# Patient Record
Sex: Male | Born: 1935
Health system: Southern US, Community
[De-identification: ages and names within clinical notes are randomized; demographics above are authoritative.]

## PROBLEM LIST (undated history)

## (undated) DIAGNOSIS — I4891 Unspecified atrial fibrillation: Secondary | ICD-10-CM

## (undated) DIAGNOSIS — F419 Anxiety disorder, unspecified: Secondary | ICD-10-CM

## (undated) DIAGNOSIS — I10 Essential (primary) hypertension: Secondary | ICD-10-CM

## (undated) DIAGNOSIS — F329 Major depressive disorder, single episode, unspecified: Secondary | ICD-10-CM

## (undated) DIAGNOSIS — R42 Dizziness and giddiness: Secondary | ICD-10-CM

## (undated) DIAGNOSIS — F32A Depression, unspecified: Secondary | ICD-10-CM

## (undated) DIAGNOSIS — N200 Calculus of kidney: Secondary | ICD-10-CM

## (undated) HISTORY — PX: KIDNEY STONE SURGERY: SHX686

## (undated) HISTORY — DX: Calculus of kidney: N20.0

## (undated) HISTORY — DX: Unspecified atrial fibrillation: I48.91

## (undated) HISTORY — PX: CHOLECYSTECTOMY: SHX55

## (undated) HISTORY — DX: Essential (primary) hypertension: I10

## (undated) HISTORY — DX: Depression, unspecified: F32.A

## (undated) HISTORY — DX: Major depressive disorder, single episode, unspecified: F32.9

## (undated) HISTORY — DX: Anxiety disorder, unspecified: F41.9

---

## 1999-02-24 ENCOUNTER — Encounter: Payer: Self-pay | Admitting: Emergency Medicine

## 1999-02-24 ENCOUNTER — Emergency Department (HOSPITAL_COMMUNITY): Admission: EM | Admit: 1999-02-24 | Discharge: 1999-02-24 | Payer: Self-pay | Admitting: Emergency Medicine

## 1999-02-25 ENCOUNTER — Ambulatory Visit (HOSPITAL_COMMUNITY): Admission: RE | Admit: 1999-02-25 | Discharge: 1999-02-25 | Payer: Self-pay | Admitting: Gastroenterology

## 2001-10-14 ENCOUNTER — Ambulatory Visit (HOSPITAL_COMMUNITY): Admission: RE | Admit: 2001-10-14 | Discharge: 2001-10-14 | Payer: Self-pay | Admitting: Gastroenterology

## 2005-09-28 ENCOUNTER — Observation Stay (HOSPITAL_COMMUNITY): Admission: EM | Admit: 2005-09-28 | Discharge: 2005-09-29 | Payer: Self-pay | Admitting: Emergency Medicine

## 2014-10-10 ENCOUNTER — Other Ambulatory Visit (HOSPITAL_COMMUNITY): Payer: Self-pay | Admitting: Family Medicine

## 2014-10-10 DIAGNOSIS — I4891 Unspecified atrial fibrillation: Secondary | ICD-10-CM

## 2014-10-16 ENCOUNTER — Ambulatory Visit (HOSPITAL_COMMUNITY)
Admission: RE | Admit: 2014-10-16 | Discharge: 2014-10-16 | Disposition: A | Discharge status: Home / Self Care | Payer: Medicare PPO | Source: Ambulatory Visit | Attending: Family Medicine | Admitting: Family Medicine

## 2014-10-16 DIAGNOSIS — I4891 Unspecified atrial fibrillation: Secondary | ICD-10-CM | POA: Insufficient documentation

## 2014-10-16 NOTE — Progress Notes (Signed)
  Echocardiogram  2D Echocardiogram has been performed.  Leta Jungling M 10/16/2014, 11:43 AM

## 2014-11-16 ENCOUNTER — Encounter: Payer: Self-pay | Admitting: Cardiology

## 2014-11-16 ENCOUNTER — Ambulatory Visit (INDEPENDENT_AMBULATORY_CARE_PROVIDER_SITE_OTHER): Payer: Medicare PPO | Admitting: Cardiology

## 2014-11-16 ENCOUNTER — Encounter: Payer: Self-pay | Admitting: Internal Medicine

## 2014-11-16 VITALS — BP 150/80 | HR 116 | Ht 72.0 in | Wt 249.0 lb

## 2014-11-16 DIAGNOSIS — I4891 Unspecified atrial fibrillation: Secondary | ICD-10-CM | POA: Insufficient documentation

## 2014-11-16 DIAGNOSIS — I481 Persistent atrial fibrillation: Secondary | ICD-10-CM | POA: Diagnosis not present

## 2014-11-16 DIAGNOSIS — I429 Cardiomyopathy, unspecified: Secondary | ICD-10-CM

## 2014-11-16 DIAGNOSIS — I428 Other cardiomyopathies: Secondary | ICD-10-CM | POA: Insufficient documentation

## 2014-11-16 DIAGNOSIS — I482 Chronic atrial fibrillation, unspecified: Secondary | ICD-10-CM

## 2014-11-16 DIAGNOSIS — I4819 Other persistent atrial fibrillation: Secondary | ICD-10-CM

## 2014-11-16 MED ORDER — DILTIAZEM HCL ER BEADS 180 MG PO CP24: 180.0000 mg | ORAL_CAPSULE | Freq: Every day | ORAL | Status: DC

## 2014-11-16 NOTE — Progress Notes (Signed)
Cardiology Office Note   Date:  11/16/2014   ID:  Gary Bean, DOB 1934-09-30, MRN [id number]  PCP:  Thora Lance, MD  Cardiologist:   Rollene Rotunda, MD   Chief Complaint  Patient presents with  . Atrial Fibrillation      History of Present Illness: Gary Bean is a 80 y.o. male who presents for evaluation of atrial fibrillation. He was found to have this last month when he was being evaluated for HTN.  He was started on Xarelto and Cardizem.  He really doesn't notice that he is in this.  He might feel rare palpitations.  However, he has no other cardiac complaints.  The patient denies any new symptoms such as chest discomfort, neck or arm discomfort. There has been no new shortness of breath, PND or orthopnea. There has been no presyncope or syncope.  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  Of note I did review the results of an echocardiogram which demonstrated mild global hypokinesis with an ejection fraction of 45-50%. There was some moderate tricuspid regurgitation. He did have some left atrial enlargement with some mild mitral regurgitation. The patient has otherwise never had a cardiac history.  Labs done recently demonstrates normal electrolytes and TSH.    Past Medical History  Diagnosis Date  . Atrial fibrillation   . Hypertension   . Depression   . Anxiety   . Nephrolithiasis     Past Surgical History  Procedure Laterality Date  . Cholecystectomy       Current Outpatient Prescriptions  Medication Sig Dispense Refill  . acetaminophen (TYLENOL) 500 MG tablet Take 500 mg by mouth every 6 (six) hours as needed.    . Omega-3 Fatty Acids (FISH OIL) 1000 MG CAPS Take 1,000 mg by mouth.    . Probiotic Product (ALIGN PO) Take by mouth.    . rivaroxaban (XARELTO) 20 MG TABS tablet Take 20 mg by mouth daily with supper.    . tamsulosin (FLOMAX) 0.4 MG CAPS capsule Take 0.4 mg by mouth.    . triamterene-hydrochlorothiazide (MAXZIDE-25)  37.5-25 MG per tablet   3  . diltiazem (TIAZAC) 180 MG 24 hr capsule Take 1 capsule (180 mg total) by mouth daily. 90 capsule 3   No current facility-administered medications for this visit.    Allergies:   Review of patient's allergies indicates not on file.    Social History:  The patient  reports that he has quit smoking. His smoking use included Cigarettes. He has a 10 pack-year smoking history. He does not have any smokeless tobacco history on file.   Family History:  The patient's family history includes CAD (age of onset: 66) in his father; Goiter in his mother; Prostate cancer in his brother; Rectal cancer (age of onset: 46) in his sister.    ROS:  Please see the history of present illness.   Otherwise, review of systems are positive for positive for reflux. Otherwise as stated in the history of present illness and negative for all other systems..   All other systems are reviewed and negative.    PHYSICAL EXAM: VS:  BP 150/80 mmHg  Pulse 116  Ht 6' (1.829 m)  Wt 249 lb (112.946 kg)  BMI 33.76 kg/m2 , BMI Body mass index is 33.76 kg/(m^2). GENERAL:  Well appearing HEENT:  Pupils equal round and reactive, fundi not visualized, oral mucosa unremarkable NECK:  No jugular venous distention, waveform within normal  limits, carotid upstroke brisk and symmetric, no bruits, no thyromegaly LYMPHATICS:  No cervical, inguinal adenopathy LUNGS:  Clear to auscultation bilaterally BACK:  No CVA tenderness CHEST:  Unremarkable HEART:  PMI not displaced or sustained,S1 and S2 within normal limits, no S3,, no clicks, no rubs, no murmurs, irregular ABD:  Flat, positive bowel sounds normal in frequency in pitch, no bruits, no rebound, no guarding, no midline pulsatile mass, no hepatomegaly, no splenomegaly EXT:  2 plus pulses throughout, no edema, no cyanosis no clubbing SKIN:  No rashes no nodules NEURO:  Cranial nerves II through XII grossly intact, motor grossly intact throughout PSYCH:   Cognitively intact, oriented to person place and time    EKG:  EKG is ordered today. The ekg ordered today demonstrates atrial fibrillation, rate 116, axis within normal limits, intervals within normal limits, low voltage limb leads.   Recent Labs: No results found for requested labs within last 365 days.      Wt Readings from Last 3 Encounters:  11/16/14 249 lb (112.946 kg)      Other studies Reviewed: Additional studies/ records that were reviewed today include: Echo and outside labs.  . Review of the above records demonstrates:  Please see elsewhere in the note.     ASSESSMENT AND PLAN:  Atrial fib:  I will apply a 24-hour Holter to make sure this is persistent atrial fibrillation. I'll increase his Cardizem for better rate control. He has only been on the 20 mg dose of Xarelto for 2 weeks. In 2 more weeks after he has been on this uninterrupted I will plan cardioversion. Of note he does drink multiple drinks a week and he will stop this.  Cardiomyopathy:  I suspect this might be rate related. I will reassess this in the future with follow-up echoes after he is rate controlled and 4 in sinus rhythm.  HTN:  His blood pressure is elevated but this will be managed in the context of treating his atrial fibrillation.  Obese:   We talked about this and began education.   Current medicines are reviewed at length with the patient today.  The patient does not have concerns regarding medicines.  The following changes have been made:  See elsewhere  Labs/ tests ordered today include:   Orders Placed This Encounter  Procedures  . Holter monitor - 24 hour     Disposition:   FU with me after the cardioversion.     Signed, Rollene Rotunda, MD  11/16/2014 1:13 PM    [facility]

## 2014-11-16 NOTE — Addendum Note (Signed)
Addended by: Vincenza Hews on: 11/16/2014 02:49 PM   Modules accepted: Orders

## 2014-11-16 NOTE — Patient Instructions (Signed)
Your physician recommends that you schedule a follow-up appointment in: 6 weeks after cardioversion with Dr. Antoine Poche  We will schedule a cardioversion in two weeks  We have increased your Tiazac to 180 mg daily  We will place a monitor for you to wear for 24 hrs                       This will have to be done over at the [address] office

## 2014-11-20 ENCOUNTER — Other Ambulatory Visit: Payer: Self-pay | Admitting: *Deleted

## 2014-11-20 ENCOUNTER — Telehealth: Payer: Self-pay | Admitting: Cardiology

## 2014-11-20 DIAGNOSIS — I482 Chronic atrial fibrillation, unspecified: Secondary | ICD-10-CM

## 2014-11-20 NOTE — Telephone Encounter (Signed)
New Message   Pt called to schedule 24 hour Holter appt per recent office visit  Please place order   Orders Placed This Encounter  Procedures   Holter monitor - 24 hour

## 2014-11-21 NOTE — Addendum Note (Signed)
Addended by: Vincenza Hews on: 11/21/2014 09:51 AM   Modules accepted: Orders

## 2014-11-23 ENCOUNTER — Other Ambulatory Visit: Payer: Self-pay | Admitting: *Deleted

## 2014-11-23 ENCOUNTER — Telehealth: Payer: Self-pay | Admitting: Cardiology

## 2014-11-23 DIAGNOSIS — E878 Other disorders of electrolyte and fluid balance, not elsewhere classified: Secondary | ICD-10-CM

## 2014-11-23 DIAGNOSIS — R531 Weakness: Secondary | ICD-10-CM

## 2014-11-23 LAB — BASIC METABOLIC PANEL
BUN: 22 mg/dL (ref 6–23)
CHLORIDE: 103 meq/L (ref 96–112)
CO2: 27 meq/L (ref 19–32)
Calcium: 8.5 mg/dL (ref 8.4–10.5)
Creat: 0.99 mg/dL (ref 0.50–1.35)
Glucose, Bld: 110 mg/dL — ABNORMAL HIGH (ref 70–99)
Potassium: 3.7 mEq/L (ref 3.5–5.3)
Sodium: 141 mEq/L (ref 135–145)

## 2014-11-23 LAB — TSH: TSH: 0.884 u[IU]/mL (ref 0.350–4.500)

## 2014-11-23 NOTE — Telephone Encounter (Signed)
RN informed Gary Bean and patient. Will have labs placed in computer RN spoke to Fredonia Highland LPN- informed lab orders needed for upcoming cardioversion. Per JC, lab orders entered.

## 2014-11-23 NOTE — Telephone Encounter (Signed)
Katrina need lab orders for the pt who is currently in the office  Thanks

## 2014-11-27 ENCOUNTER — Ambulatory Visit (INDEPENDENT_AMBULATORY_CARE_PROVIDER_SITE_OTHER): Payer: Medicare PPO

## 2014-11-27 DIAGNOSIS — I482 Chronic atrial fibrillation: Secondary | ICD-10-CM | POA: Diagnosis not present

## 2014-11-29 ENCOUNTER — Other Ambulatory Visit: Payer: Self-pay | Admitting: *Deleted

## 2014-11-29 DIAGNOSIS — Z0189 Encounter for other specified special examinations: Secondary | ICD-10-CM

## 2014-11-29 DIAGNOSIS — I4891 Unspecified atrial fibrillation: Secondary | ICD-10-CM

## 2014-11-30 ENCOUNTER — Ambulatory Visit (HOSPITAL_COMMUNITY): Payer: Medicare PPO | Admitting: Anesthesiology

## 2014-11-30 ENCOUNTER — Encounter (HOSPITAL_COMMUNITY): Payer: Self-pay | Admitting: *Deleted

## 2014-11-30 ENCOUNTER — Encounter (HOSPITAL_COMMUNITY)
Admission: RE | Disposition: A | Discharge status: Home / Self Care | Payer: Self-pay | Source: Ambulatory Visit | Attending: Cardiology

## 2014-11-30 ENCOUNTER — Ambulatory Visit (HOSPITAL_COMMUNITY)
Admission: RE | Admit: 2014-11-30 | Discharge: 2014-11-30 | Disposition: A | Discharge status: Home / Self Care | Payer: Medicare PPO | Source: Ambulatory Visit | Attending: Cardiology | Admitting: Cardiology

## 2014-11-30 DIAGNOSIS — Z0189 Encounter for other specified special examinations: Secondary | ICD-10-CM | POA: Diagnosis not present

## 2014-11-30 DIAGNOSIS — I4891 Unspecified atrial fibrillation: Secondary | ICD-10-CM | POA: Insufficient documentation

## 2014-11-30 DIAGNOSIS — Z87891 Personal history of nicotine dependence: Secondary | ICD-10-CM | POA: Insufficient documentation

## 2014-11-30 DIAGNOSIS — I10 Essential (primary) hypertension: Secondary | ICD-10-CM | POA: Diagnosis not present

## 2014-11-30 DIAGNOSIS — R9431 Abnormal electrocardiogram [ECG] [EKG]: Secondary | ICD-10-CM | POA: Diagnosis not present

## 2014-11-30 HISTORY — PX: CARDIOVERSION: SHX1299

## 2014-11-30 SURGERY — CARDIOVERSION
Anesthesia: Monitor Anesthesia Care

## 2014-11-30 MED ORDER — ETOMIDATE 2 MG/ML IV SOLN
INTRAVENOUS | Status: AC
  Filled 2014-11-30: qty 10

## 2014-11-30 MED ORDER — SODIUM CHLORIDE 0.9 % IV SOLN
INTRAVENOUS | Status: DC
  Administered 2014-11-30: 09:00:00 via INTRAVENOUS
  Administered 2014-11-30: 500 mL via INTRAVENOUS

## 2014-11-30 MED ORDER — PROPOFOL 10 MG/ML IV BOLUS
INTRAVENOUS | Status: DC | PRN
  Administered 2014-11-30: 80 mg via INTRAVENOUS

## 2014-11-30 MED ORDER — SODIUM CHLORIDE 0.9 % IJ SOLN
INTRAMUSCULAR | Status: AC
  Filled 2014-11-30: qty 10

## 2014-11-30 MED ORDER — LIDOCAINE HCL (CARDIAC) 20 MG/ML IV SOLN
INTRAVENOUS | Status: DC | PRN
  Administered 2014-11-30: 20 mg via INTRAVENOUS

## 2014-11-30 MED ORDER — PROPOFOL 10 MG/ML IV BOLUS
INTRAVENOUS | Status: AC
  Filled 2014-11-30: qty 20

## 2014-11-30 MED ORDER — SUCCINYLCHOLINE CHLORIDE 20 MG/ML IJ SOLN
INTRAMUSCULAR | Status: AC
  Filled 2014-11-30: qty 2

## 2014-11-30 MED ORDER — LIDOCAINE HCL (CARDIAC) 20 MG/ML IV SOLN
INTRAVENOUS | Status: AC
Start: 2014-11-30 — End: 2014-11-30
  Filled 2014-11-30: qty 10

## 2014-11-30 MED ORDER — SODIUM CHLORIDE 0.9 % IJ SOLN: 3.0000 mL | INTRAMUSCULAR | Status: DC | PRN

## 2014-11-30 MED ORDER — EPHEDRINE SULFATE 50 MG/ML IJ SOLN
INTRAMUSCULAR | Status: AC
  Filled 2014-11-30: qty 1

## 2014-11-30 NOTE — CV Procedure (Signed)
    CARDIOVERSION NOTE  Procedure: Electrical Cardioversion Indications:  Atrial Fibrillation  Procedure Details:  Consent: Risks of procedure as well as the alternatives and risks of each were explained to the (patient/caregiver).  Consent for procedure obtained.  Time Out: Verified patient identification, verified procedure, site/side was marked, verified correct patient position, special equipment/implants available, medications/allergies/relevent history reviewed, required imaging and test results available.  Performed  Patient placed on cardiac monitor, pulse oximetry, supplemental oxygen as necessary.  Sedation given: Propofol per anesthesia Pacer pads placed anterior and posterior chest.  Cardioverted 1 time(s).  Cardioverted at 150J biphasic.  Impression: Findings: Post procedure EKG shows: NSR Complications: None Patient did tolerate procedure well.  Plan: 1. Successful DCCV to NSR with a single 150J biphasic shock. 2. Follow-up with Dr. Antoine Poche.  Time Spent Directly with the Patient:  30 minutes   Chrystie Nose, MD, [facility] Attending Cardiologist [facility]  Chrystie Nose 11/30/2014, 9:31 AM

## 2014-11-30 NOTE — Anesthesia Postprocedure Evaluation (Signed)
  Anesthesia Post-op Note  Patient: Gary Bean  Procedure(s) Performed: Procedure(s) with comments: CARDIOVERSION (N/A) - 09:19 elective cardioversion with propofol anesthesia,  @ 150 joules from Afib to SR with occassional PVC  Patient Location: Endoscopy  Anesthesia Type:General  Level of Consciousness: awake, alert  and oriented  Airway and Oxygen Therapy: Patient Spontanous Breathing and Patient connected to nasal cannula oxygen  Post-op Pain: mild  Post-op Assessment: Post-op Vital signs reviewed, Patient's Cardiovascular Status Stable, Respiratory Function Stable, Patent Airway and Pain level controlled              Post-op Vital Signs: stable  Last Vitals:  Filed Vitals:   11/30/14 0932  BP: 136/82  Pulse:   Temp:   Resp: 13    Complications: No apparent anesthesia complications

## 2014-11-30 NOTE — Anesthesia Preprocedure Evaluation (Addendum)
Anesthesia Evaluation  Patient identified by MRN, date of birth, ID band Patient awake    Reviewed: Allergy & Precautions, NPO status , Patient's Chart, lab work & pertinent test results  Airway Mallampati: II  TM Distance: >3 FB Neck ROM: Full    Dental  (+) Teeth Intact, Dental Advisory Given   Pulmonary former smoker,  breath sounds clear to auscultation        Cardiovascular hypertension, Rhythm:Irregular Rate:Normal     Neuro/Psych    GI/Hepatic   Endo/Other    Renal/GU      Musculoskeletal   Abdominal   Peds  Hematology   Anesthesia Other Findings   Reproductive/Obstetrics                            Anesthesia Physical Anesthesia Plan  ASA: III  Anesthesia Plan: General   Post-op Pain Management:    Induction: Intravenous  Airway Management Planned: Mask  Additional Equipment:   Intra-op Plan:   Post-operative Plan:   Informed Consent: I have reviewed the patients History and Physical, chart, labs and discussed the procedure including the risks, benefits and alternatives for the proposed anesthesia with the patient or authorized representative who has indicated his/her understanding and acceptance.     Plan Discussed with: CRNA and Anesthesiologist  Anesthesia Plan Comments:         Anesthesia Quick Evaluation

## 2014-11-30 NOTE — Discharge Instructions (Signed)
Electrical Cardioversion °Electrical cardioversion is the delivery of a jolt of electricity to change the rhythm of the heart. Sticky patches or metal paddles are placed on the chest to deliver the electricity from a device. This is done to restore a normal rhythm. A rhythm that is too fast or not regular keeps the heart from pumping well. °Electrical cardioversion is done in an emergency if:  °· There is low or no blood pressure as a result of the heart rhythm.   °· Normal rhythm must be restored as fast as possible to protect the brain and heart from further damage.   °· It may save a life. °Cardioversion may be done for heart rhythms that are not immediately life threatening, such as atrial fibrillation or flutter, in which:  °· The heart is beating too fast or is not regular.   °· Medicine to change the rhythm has not worked.   °· It is safe to wait in order to allow time for preparation. °· Symptoms of the abnormal rhythm are bothersome. °· The risk of stroke and other serious problems can be reduced. °LET YOUR HEALTH CARE PROVIDER KNOW ABOUT:  °· Any allergies you have. °· All medicines you are taking, including vitamins, herbs, eye drops, creams, and over-the-counter medicines. °· Previous problems you or members of your family have had with the use of anesthetics.   °· Any blood disorders you have.   °· Previous surgeries you have had.   °· Medical conditions you have. °RISKS AND COMPLICATIONS  °Generally, this is a safe procedure. However, problems can occur and include:  °· Breathing problems related to the anesthetic used. °· A blood clot that breaks free and travels to other parts of your body. This could cause a stroke or other problems. The risk of this is lowered by use of blood-thinning medicine (anticoagulant) prior to the procedure. °· Cardiac arrest (rare). °BEFORE THE PROCEDURE  °· You may have tests to detect blood clots in your heart and to evaluate heart function.  °· You may start taking  anticoagulants so your blood does not clot as easily.   °· Medicines may be given to help stabilize your heart rate and rhythm. °PROCEDURE °· You will be given medicine through an IV tube to reduce discomfort and make you sleepy (sedative).   °· An electrical shock will be delivered. °AFTER THE PROCEDURE °Your heart rhythm will be watched to make sure it does not change.  °Document Released: 05/29/2002 Document Revised: 10/23/2013 Document Reviewed: 12/21/2012 °Exit[employer]® Patient Information ©2015 [employer]. This information is not intended to replace advice given to you by your health care provider. Make sure you discuss any questions you have with your health care provider. ° °Electrical Cardioversion, Care After °Refer to this sheet in the next few weeks. These instructions provide you with information on caring for yourself after your procedure. Your health care provider may also give you more specific instructions. Your treatment has been planned according to current medical practices, but problems sometimes occur. Call your health care provider if you have any problems or questions after your procedure. °WHAT TO EXPECT AFTER THE PROCEDURE °After your procedure, it is typical to have the following sensations: °· Some redness on the skin where the shocks were delivered. If this is tender, a sunburn lotion or hydrocortisone cream may help. °· Possible return of an abnormal heart rhythm within hours or days after the procedure. °HOME CARE INSTRUCTIONS °· Take medicines only as directed by your health care provider. Be sure you understand how and when   to take your medicine. °· Learn how to feel your pulse and check it often. °· Limit your activity for 48 hours after the procedure or as directed by your health care provider. °· Avoid or minimize caffeine and other stimulants as directed by your health care provider. °SEEK MEDICAL CARE IF: °· You feel like your heart is beating too fast or your pulse is not  regular. °· You have any questions about your medicines. °· You have bleeding that will not stop. °SEEK IMMEDIATE MEDICAL CARE IF: °· You are dizzy or feel faint. °· It is hard to breathe or you feel short of breath. °· There is a change in discomfort in your chest. °· Your speech is slurred or you have trouble moving an arm or leg on one side of your body. °· You get a serious muscle cramp that does not go away. °· Your fingers or toes turn cold or blue. °Document Released: 03/29/2013 Document Revised: 10/23/2013 Document Reviewed: 03/29/2013 °ExitCare® Patient Information ©2015 [employer]. This information is not intended to replace advice given to you by your health care provider. Make sure you discuss any questions you have with your health care provider. ° °

## 2014-11-30 NOTE — Transfer of Care (Signed)
Immediate Anesthesia Transfer of Care Note  Patient: Gary Bean  Procedure(s) Performed: Procedure(s) with comments: CARDIOVERSION (N/A) - 09:19 elective cardioversion with propofol anesthesia,  @ 150 joules from Afib to SR with occassional PVC  Patient Location: PACU and Endoscopy Unit  Anesthesia Type:MAC  Level of Consciousness: awake and alert   Airway & Oxygen Therapy: Patient Spontanous Breathing and Patient connected to nasal cannula oxygen  Post-op Assessment: Report given to RN and Post -op Vital signs reviewed and stable  Post vital signs: Reviewed and stable  Last Vitals:  Filed Vitals:   11/30/14 0804  BP: 141/89  Temp: 36.6 C  Resp: 15    Complications: No apparent anesthesia complications

## 2014-11-30 NOTE — H&P (Signed)
     INTERVAL PROCEDURE H&P  History and Physical Interval Note:  11/30/2014 8:12 AM  Gary Bean has presented today for their planned procedure. The various methods of treatment have been discussed with the patient and family. After consideration of risks, benefits and other options for treatment, the patient has consented to the procedure.  The patients' outpatient history has been reviewed, patient examined, and no change in status from most recent office note within the past 30 days. I have reviewed the patients' chart and labs and will proceed as planned. Questions were answered to the patient's satisfaction.   Chrystie Nose, MD, [facility] Attending Cardiologist [facility]  Lisette Abu Tayler Heiden 11/30/2014, 8:12 AM

## 2014-12-03 ENCOUNTER — Encounter (HOSPITAL_COMMUNITY): Payer: Self-pay | Admitting: Internal Medicine

## 2014-12-06 ENCOUNTER — Telehealth: Payer: Self-pay | Admitting: Cardiology

## 2014-12-06 NOTE — Telephone Encounter (Signed)
He should have a dentist look at his gums.  If he bleeds again he can stop the Xarelto until I am able to talk to him.

## 2014-12-06 NOTE — Telephone Encounter (Signed)
Pt advised of instruction from Dr. Antoine Poche, he expressed understanding.

## 2014-12-06 NOTE — Telephone Encounter (Signed)
Mr.Bittman is calling because he is on Xarelto and his gums are bleeding . Please call    Thanks

## 2014-12-06 NOTE — Telephone Encounter (Signed)
Pt notes bleeding gums on Xarelto. Yesterday was the 4th occurrence in 1.5 weeks. Prior to this, episodes had been brief - (ex. 1st time occurred w/ brushing teeth, lasted a few minutes.)  Yesterday bleeding seemed to occur spontaneously, started mid-afternoon around 3pm and resolved finally around 6pm. He notes bleeding overall was scant, thinks "not a lot" of blood in terms of volume.  Denies bleeding elsewhere, no darkened urine/stool.  Was put on Xarelto for A Fib.  Cardioversion performed on 6/10. Makes conjecture he is in sinus rhythm now. No other acute complaints. No current bleeding problems today. Has f/u July 22nd.  Will route to Dr. Antoine Poche for advice.

## 2015-01-11 ENCOUNTER — Encounter: Payer: Self-pay | Admitting: Cardiology

## 2015-01-11 ENCOUNTER — Ambulatory Visit (INDEPENDENT_AMBULATORY_CARE_PROVIDER_SITE_OTHER): Payer: Medicare PPO | Admitting: Cardiology

## 2015-01-11 VITALS — BP 162/96 | HR 82 | Ht 72.0 in | Wt 248.1 lb

## 2015-01-11 DIAGNOSIS — I481 Persistent atrial fibrillation: Secondary | ICD-10-CM | POA: Diagnosis not present

## 2015-01-11 DIAGNOSIS — I4891 Unspecified atrial fibrillation: Secondary | ICD-10-CM | POA: Diagnosis not present

## 2015-01-11 DIAGNOSIS — I429 Cardiomyopathy, unspecified: Secondary | ICD-10-CM | POA: Diagnosis not present

## 2015-01-11 DIAGNOSIS — I4819 Other persistent atrial fibrillation: Secondary | ICD-10-CM

## 2015-01-11 DIAGNOSIS — I428 Other cardiomyopathies: Secondary | ICD-10-CM

## 2015-01-11 MED ORDER — APIXABAN 5 MG PO TABS: 5.0000 mg | ORAL_TABLET | Freq: Two times a day (BID) | ORAL | Status: DC

## 2015-01-11 NOTE — Patient Instructions (Signed)
Your physician wants you to follow-up in: 4 Months. You will receive a reminder letter in the mail two months in advance. If you don't receive a letter, please call our office to schedule the follow-up appointment.  Your physician has recommended you make the following change in your medication: START Eliquis 5 mg twice a day.

## 2015-01-11 NOTE — Progress Notes (Signed)
Cardiology Office Note   Date:  01/11/2015   ID:  KYMIR COLES, DOB [birth year], MRN [id number]  PCP:  Thora Lance, MD  Cardiologist:   Rollene Rotunda, MD   Chief Complaint  Patient presents with  . Atrial Fibrillation      History of Present Illness: Gary Bean is a 80 y.o. male who presents for evaluation of atrial fibrillation. He was found to have this when he was being evaluated for HTN.  He really doesn't notice that he is in this.  He only notices it when he takes his blood pressure. Marland Kitchen However, he has no other cardiac complaints.  The patient denies any new symptoms such as chest discomfort, neck or arm discomfort. There has been no new shortness of breath, PND or orthopnea.   He is now status post cardioversion. He is in sinus rhythm today. Of note he had bleeding in his gums. This recurred multiple times in the soft tissues Xarelto   Past Medical History  Diagnosis Date  . Atrial fibrillation   . Hypertension   . Depression   . Anxiety   . Nephrolithiasis     Past Surgical History  Procedure Laterality Date  . Cholecystectomy    . Cardioversion N/A 11/30/2014    Procedure: CARDIOVERSION;  Surgeon: Chrystie Nose, MD;  Location: [facility] ENDOSCOPY;  Service: Cardiovascular;  Laterality: N/A;  09:19 elective cardioversion with propofol anesthesia,  @ 150 joules from Afib to SR with occassional PVC     Current Outpatient Prescriptions  Medication Sig Dispense Refill  . acetaminophen (TYLENOL) 500 MG tablet Take 500 mg by mouth every 6 (six) hours as needed for mild pain or moderate pain.     Marland Kitchen diltiazem (TIAZAC) 180 MG 24 hr capsule Take 1 capsule (180 mg total) by mouth daily. 90 capsule 3  . Omega-3 Fatty Acids (FISH OIL) 1000 MG CAPS Take 1,000 mg by mouth.    . Probiotic Product (ALIGN PO) Take 1 tablet by mouth daily.     . tamsulosin (FLOMAX) 0.4 MG CAPS capsule Take 0.4 mg by mouth daily after breakfast.     . triamterene-hydrochlorothiazide  (MAXZIDE-25) 37.5-25 MG per tablet Take 1 tablet by mouth daily.   3   No current facility-administered medications for this visit.    Allergies:   Review of patient's allergies indicates no known allergies.     ROS:  Please see the history of present illness.   Otherwise, review of systems are positive for positive for reflux. Otherwise as stated in the history of present illness and negative for all other systems..   All other systems are reviewed and negative.    PHYSICAL EXAM: VS:  BP 162/96 mmHg  Pulse 82  Ht 6' (1.829 m)  Wt 248 lb 1.6 oz (112.537 kg)  BMI 33.64 kg/m2 , BMI Body mass index is 33.64 kg/(m^2). GENERAL:  Well appearing HEENT:  No evidence of bleeding, poor dentition NECK:  No jugular venous distention, waveform within normal limits, carotid upstroke brisk and symmetric, no bruits, no thyromegaly LUNGS:  Clear to auscultation bilaterally BACK:  No CVA tenderness CHEST:  Unremarkable HEART:  PMI not displaced or sustained,S1 and S2 within normal limits, no S3, S4, no clicks, no rubs, no murmurs ABD:  Flat, positive bowel sounds normal in frequency in pitch, no bruits, no rebound, no guarding, no midline pulsatile mass, no hepatomegaly, no splenomegaly EXT:  2 plus pulses throughout, no edema, no cyanosis no clubbing  EKG:  EKG is ordered today. NSR, rate 72, leftward axis, intervals within normal limits, no acute ST-T wave changes.   Recent Labs: 11/23/2014: BUN 22; Creat 0.99; Potassium 3.7; Sodium 141; TSH 0.884      Wt Readings from Last 3 Encounters:  01/11/15 248 lb 1.6 oz (112.537 kg)  11/16/14 249 lb (112.946 kg)      Other studies Reviewed: Additional studies/ records that were reviewed today include:  DCCV records Review of the above records demonstrates:  Please see elsewhere in the note.     ASSESSMENT AND PLAN:  Atrial fib:  The patient should be on anticoagulation. Mr. TUSHAR DENNEE has a CHA2DS2 - VASc score of 3 with a risk of  stroke of 3.2% .  He does not want to Xarelto because of the bleeding gums. He's going to see a dentist about this and were going to start Eliquis 5 mg twice daily.  Cardiomyopathy:  I suspect this might be rate related. I will reassess this in the future with follow-up echoes.  He seems to be euvolemic.  HTN:  His blood pressure is elevated but he keeps a blood pressure diary at home. His blood pressure is well controlled otherwise. No change in therapy is indicated.  Obese:   We talked about this and began education.   Current medicines are reviewed at length with the patient today.  The patient does not have concerns regarding medicines.  The following changes have been made:  See elsewhere  Labs/ tests ordered today include:   No orders of the defined types were placed in this encounter.     Disposition:   FU with me after the cardioversion.     Signed, Rollene Rotunda, MD  01/11/2015 8:03 AM    [facility]

## 2015-01-15 DIAGNOSIS — N183 Chronic kidney disease, stage 3 (moderate): Secondary | ICD-10-CM | POA: Diagnosis not present

## 2015-01-15 DIAGNOSIS — I4891 Unspecified atrial fibrillation: Secondary | ICD-10-CM | POA: Diagnosis not present

## 2015-01-15 DIAGNOSIS — N400 Enlarged prostate without lower urinary tract symptoms: Secondary | ICD-10-CM | POA: Diagnosis not present

## 2015-01-15 DIAGNOSIS — I129 Hypertensive chronic kidney disease with stage 1 through stage 4 chronic kidney disease, or unspecified chronic kidney disease: Secondary | ICD-10-CM | POA: Diagnosis not present

## 2015-01-15 DIAGNOSIS — R7301 Impaired fasting glucose: Secondary | ICD-10-CM | POA: Diagnosis not present

## 2015-02-13 DIAGNOSIS — N138 Other obstructive and reflux uropathy: Secondary | ICD-10-CM | POA: Diagnosis not present

## 2015-02-13 DIAGNOSIS — R3915 Urgency of urination: Secondary | ICD-10-CM | POA: Diagnosis not present

## 2015-02-13 DIAGNOSIS — R351 Nocturia: Secondary | ICD-10-CM | POA: Diagnosis not present

## 2015-02-13 DIAGNOSIS — N529 Male erectile dysfunction, unspecified: Secondary | ICD-10-CM | POA: Diagnosis not present

## 2015-02-13 DIAGNOSIS — N401 Enlarged prostate with lower urinary tract symptoms: Secondary | ICD-10-CM | POA: Diagnosis not present

## 2015-02-20 ENCOUNTER — Encounter: Payer: Self-pay | Admitting: Cardiology

## 2015-02-20 ENCOUNTER — Telehealth: Payer: Self-pay | Admitting: Cardiology

## 2015-02-21 NOTE — Telephone Encounter (Signed)
Close encounter 

## 2015-05-03 ENCOUNTER — Encounter: Payer: Self-pay | Admitting: Cardiology

## 2015-05-03 ENCOUNTER — Ambulatory Visit (INDEPENDENT_AMBULATORY_CARE_PROVIDER_SITE_OTHER): Payer: Medicare PPO | Admitting: Cardiology

## 2015-05-03 VITALS — BP 168/90 | HR 88 | Ht 72.0 in | Wt 253.0 lb

## 2015-05-03 DIAGNOSIS — I481 Persistent atrial fibrillation: Secondary | ICD-10-CM | POA: Diagnosis not present

## 2015-05-03 DIAGNOSIS — I4819 Other persistent atrial fibrillation: Secondary | ICD-10-CM

## 2015-05-03 NOTE — Progress Notes (Signed)
Cardiology Office Note   Date:  05/03/2015   ID:  BAUDILIO GAVRILOV, DOB February 15, 1935, MRN [id number]  PCP:  Thora Lance, MD  Cardiologist:   Rollene Rotunda, MD   Chief Complaint  Patient presents with  . Follow-up    Patient has no complaints.      History of Present Illness: Gary Bean is a 80 y.o. male who presents for evaluation of atrial fibrillation.   He is status post cardioversion. He did have some bleeding with Xarelto in his gums. He is doing better with Elavil was. He's not had any symptomatic tachypalpitations. He's had no presyncope or syncope. He is active on his job and symptoms associated with this.   Past Medical History  Diagnosis Date  . Atrial fibrillation (HCC)   . Hypertension   . Depression   . Anxiety   . Nephrolithiasis     Past Surgical History  Procedure Laterality Date  . Cholecystectomy    . Cardioversion N/A 11/30/2014    Procedure: CARDIOVERSION;  Surgeon: Chrystie Nose, MD;  Location: [facility] ENDOSCOPY;  Service: Cardiovascular;  Laterality: N/A;  09:19 elective cardioversion with propofol anesthesia,  @ 150 joules from Afib to SR with occassional PVC     Current Outpatient Prescriptions  Medication Sig Dispense Refill  . acetaminophen (TYLENOL) 500 MG tablet Take 500 mg by mouth every 6 (six) hours as needed for mild pain or moderate pain.     Marland Kitchen apixaban (ELIQUIS) 5 MG TABS tablet Take 1 tablet (5 mg total) by mouth 2 (two) times daily. 60 tablet 6  . diltiazem (TIAZAC) 180 MG 24 hr capsule Take 1 capsule (180 mg total) by mouth daily. 90 capsule 3  . Omega-3 Fatty Acids (FISH OIL) 1000 MG CAPS Take 1,000 mg by mouth.    . Probiotic Product (ALIGN PO) Take 1 tablet by mouth daily.     . tamsulosin (FLOMAX) 0.4 MG CAPS capsule Take 0.4 mg by mouth daily after breakfast.     . triamterene-hydrochlorothiazide (MAXZIDE-25) 37.5-25 MG per tablet Take 1 tablet by mouth daily.   3   No current facility-administered medications for this  visit.    Allergies:   Review of patient's allergies indicates no known allergies.     ROS:  Please see the history of present illness.   Otherwise, review of systems are positive for positive for reflux. Otherwise as stated in the history of present illness and negative for all other systems..   All other systems are reviewed and negative.    PHYSICAL EXAM: VS:  BP 168/90 mmHg  Pulse 88  Ht 6' (1.829 m)  Wt 253 lb (114.76 kg)  BMI 34.31 kg/m2 , BMI Body mass index is 34.31 kg/(m^2). GENERAL:  Well appearing NECK:  No jugular venous distention, waveform within normal limits, carotid upstroke brisk and symmetric, no bruits, no thyromegaly LUNGS:  Clear to auscultation bilaterally BACK:  No CVA tenderness CHEST:  Unremarkable HEART:  PMI not displaced or sustained,S1 and S2 within normal limits, no S3, S4, no clicks, no rubs, no murmurs ABD:  Flat, positive bowel sounds normal in frequency in pitch, no bruits, no rebound, no guarding, no midline pulsatile mass, no hepatomegaly, no splenomegaly EXT:  2 plus pulses throughout, no edema, no cyanosis no clubbing    EKG:  EKG is not ordered today.    Recent Labs: 11/23/2014: BUN 22; Creat 0.99; Potassium 3.7; Sodium 141; TSH 0.884      Wt Readings  from Last 3 Encounters:  05/03/15 253 lb (114.76 kg)  01/11/15 248 lb 1.6 oz (112.537 kg)  11/16/14 249 lb (112.946 kg)      Other studies Reviewed: Additional studies/ records that were reviewed today include:  DCCV records Review of the above records demonstrates:  Please see elsewhere in the note.     ASSESSMENT AND PLAN:  Atrial fib:  The patient should be on anticoagulation. Mr. JHAMIR PICKUP has a CHA2DS2 - VASc score of 3 with a risk of stroke of 3.2% . He is tolerating Eliquis.  No change in therapy is indicated.   Cardiomyopathy:   Follow-up echo demonstrated a mildly reduced ejection fraction. No change in therapy is indicated.  HTN:  His blood pressure is elevated  but he keeps a blood pressure diary at home. His blood pressure is well controlled otherwise. No change in therapy is indicated.   Current medicines are reviewed at length with the patient today.  The patient does not have concerns regarding medicines.  The following changes have been made:  See elsewhere  Labs/ tests ordered today include:  None  No orders of the defined types were placed in this encounter.     Disposition:   FU with me in one year.   Signed, Rollene Rotunda, MD  05/03/2015 8:40 AM    [facility]

## 2015-05-03 NOTE — Patient Instructions (Signed)
   Your physician wants you to follow-up in 12 MONTH WITH DR [facility]. You will receive a reminder letter in the mail two months in advance. If you don't receive a letter, please call our office to schedule the follow-up appointment.  If you need a refill on your cardiac medications before your next appointment, please call your pharmacy.

## 2015-05-10 ENCOUNTER — Ambulatory Visit: Payer: Medicare PPO | Admitting: Cardiology

## 2015-07-25 ENCOUNTER — Telehealth: Payer: Self-pay | Admitting: *Deleted

## 2015-07-25 NOTE — Telephone Encounter (Signed)
eliquis 5 mg approved through 06-21-37.

## 2015-08-06 ENCOUNTER — Other Ambulatory Visit: Payer: Self-pay | Admitting: Cardiology

## 2015-10-28 ENCOUNTER — Other Ambulatory Visit: Payer: Self-pay | Admitting: Cardiology

## 2015-10-28 NOTE — Telephone Encounter (Signed)
Rx Refill

## 2016-03-12 ENCOUNTER — Other Ambulatory Visit: Payer: Self-pay | Admitting: Cardiology

## 2016-05-20 NOTE — Progress Notes (Signed)
Cardiology Office Note   Date:  05/21/2016   ID:  Gary Skeensdward D Angus, DOB 11/22/1934, MRN [id number]  PCP:  Thora LanceEHINGER,ROBERT R, MD  Cardiologist:   Rollene RotundaJames Jarett Dralle, MD   No chief complaint on file.     History of Present Illness: Gary Bean is a 81 y.o. male who presents for evaluation of atrial fibrillation.   He is status post cardioversion. He did have some bleeding with Xarelto in his gums. He is doing better with Eliquis.  Since I last saw him he's had no symptoms. He denies any palpitations, presyncope or syncope. He's had no new shortness of breath, PND or orthopnea. He was able to walk up 4 flights of stairs at work yesterday without too much difficulty. Note he is back in atrial fibrillation and would not notice this.  Past Medical History:  Diagnosis Date  . Anxiety   . Atrial fibrillation (HCC)   . Depression   . Hypertension   . Nephrolithiasis     Past Surgical History:  Procedure Laterality Date  . CARDIOVERSION N/A 11/30/2014   Procedure: CARDIOVERSION;  Surgeon: Chrystie NoseKenneth C Hilty, MD;  Location[facility] ENDOSCOPY;  Service: Cardiovascular;  Laterality: N/A;  09:19 elective cardioversion with propofol anesthesia,  @ 150 joules from Afib to SR with occassional PVC  . CHOLECYSTECTOMY       Current Outpatient Prescriptions  Medication Sig Dispense Refill  . acetaminophen (TYLENOL) 500 MG tablet Take 500 mg by mouth every 6 (six) hours as needed for mild pain or moderate pain.     Marland Kitchen. diltiazem (CARDIZEM CD) 180 MG 24 hr capsule TAKE ONE CAPSULE BY MOUTH EVERY DAY 90 capsule 3  . ELIQUIS 5 MG TABS tablet TAKE 1 TABLET (5 MG TOTAL) BY MOUTH 2 (TWO) TIMES DAILY. 60 tablet 6  . Omega-3 Fatty Acids (FISH OIL) 1000 MG CAPS Take 1,000 mg by mouth.    . Probiotic Product (ALIGN PO) Take 1 tablet by mouth daily.     Marland Kitchen. triamterene-hydrochlorothiazide (MAXZIDE-25) 37.5-25 MG per tablet Take 1 tablet by mouth daily.   3   No current facility-administered medications for this visit.      Allergies:   Patient has no known allergies.     ROS:  Please see the history of present illness.   Otherwise, review of systems are positive for positive for back pain. Otherwise as stated in the history of present illness and negative for all other systems..   All other systems are reviewed and negative.    PHYSICAL EXAM: VS:  BP (!) 156/98   Pulse (!) 103   Ht 6' (1.829 m)   Wt 257 lb 3.2 oz (116.7 kg)   BMI 34.88 kg/m  , BMI Body mass index is 34.88 kg/m. GENERAL:  Well appearing NECK:  No jugular venous distention, waveform within normal limits, carotid upstroke brisk and symmetric, no bruits, no thyromegaly LUNGS:  Clear to auscultation bilaterally BACK:  No CVA tenderness CHEST:  Unremarkable HEART:  PMI not displaced or sustained,S1 and S2 within normal limits, no S3, no clicks, no rubs, no murmurs, irregular ABD:  Flat, positive bowel sounds normal in frequency in pitch, no bruits, no rebound, no guarding, no midline pulsatile mass, no hepatomegaly, no splenomegaly EXT:  2 plus pulses throughout, no edema, no cyanosis no clubbing   EKG:  EKG is ordered today. Atrial fibrillation, rate 103, axis within normal limits, intervals within normal limits, low voltage in the limb leads.  Recent Labs:  No results found for requested labs within last 8760 hours.      Wt Readings from Last 3 Encounters:  05/21/16 257 lb 3.2 oz (116.7 kg)  05/03/15 253 lb (114.8 kg)  01/11/15 248 lb 1.6 oz (112.5 kg)      Other studies Reviewed: Additional studies/ records that were reviewed today include:  None Review of the above records demonstrates:  Please see elsewhere in the note.     ASSESSMENT AND PLAN:  Atrial fib:  The patient should be on anticoagulation. Gary Bean has a CHA2DS2 - VASc score of 3 with a risk of stroke of 3.2% . He is tolerating Eliquis.  He is going a little bit fast and so I'm going to increase his Cardizem to 240 mg daily. He will otherwise  remain on the meds as listed. Of note he says that his CBC was normal as checked about twice yearly by Thora Lance, MD  Cardiomyopathy:   He has had a mildly reduced ejection fraction past but seems otherwise euvolemic. No change in therapy or further imaging is indicated at this point. Meds will be changed as above.  HTN:  His blood pressure is elevated but he keeps a blood pressure diary at home. His blood pressure is well controlled otherwise.   Current medicines are reviewed at length with the patient today.  The patient does not have concerns regarding medicines.  The following changes have been made:  See above  Labs/ tests ordered today include:  None   No orders of the defined types were placed in this encounter.    Disposition:   FU with me in six months  Signed, Rollene Rotunda, MD  05/21/2016 9:28 AM    [facility]

## 2016-05-21 ENCOUNTER — Encounter: Payer: Self-pay | Admitting: Cardiology

## 2016-05-21 ENCOUNTER — Ambulatory Visit (INDEPENDENT_AMBULATORY_CARE_PROVIDER_SITE_OTHER): Payer: Medicare Other | Admitting: Cardiology

## 2016-05-21 VITALS — BP 156/98 | HR 103 | Ht 72.0 in | Wt 257.2 lb

## 2016-05-21 DIAGNOSIS — I4819 Other persistent atrial fibrillation: Secondary | ICD-10-CM

## 2016-05-21 DIAGNOSIS — I481 Persistent atrial fibrillation: Secondary | ICD-10-CM | POA: Diagnosis not present

## 2016-05-21 MED ORDER — DILTIAZEM HCL ER COATED BEADS 240 MG PO CP24: 240.0000 mg | ORAL_CAPSULE | Freq: Every day | ORAL | 3 refills | Status: DC

## 2016-05-21 NOTE — Patient Instructions (Signed)
Medication Instructions:  INCREASE- Diltiazem 240 mg daily  Labwork: None Ordered  Testing/Procedures: None Ordered  Follow-Up: Your physician wants you to follow-up in: 6 Months. You will receive a reminder letter in the mail two months in advance. If you don't receive a letter, please call our office to schedule the follow-up appointment.   Any Other Special Instructions Will Be Listed Below (If Applicable).     If you need a refill on your cardiac medications before your next appointment, please call your pharmacy.

## 2016-08-13 ENCOUNTER — Other Ambulatory Visit: Payer: Self-pay | Admitting: Cardiology

## 2016-09-12 ENCOUNTER — Other Ambulatory Visit: Payer: Self-pay | Admitting: Cardiology

## 2016-11-18 NOTE — Progress Notes (Signed)
Cardiology Office Note   Date:  11/19/2016   ID:  GER PALADINO, DOB 12/10/1934, MRN [id number]  PCP:  Blair Heys, MD  Cardiologist:   Rollene Rotunda, MD   Chief Complaint  Patient presents with  . Atrial Fibrillation      History of Present Illness: Gary Bean is a 82 y.o. male who presents for evaluation of atrial fibrillation.   He is status post cardioversion. He did have some bleeding with Xarelto in his gums. He is doing better with Eliquis.  Since I last saw him he's done well.  The patient denies any new symptoms such as chest discomfort, neck or arm discomfort. There has been no new shortness of breath, PND or orthopnea. There have been no reported palpitations, presyncope or syncope.  He is somewhat active at work but he does not exercise routinely.   Past Medical History:  Diagnosis Date  . Anxiety   . Atrial fibrillation (HCC)   . Depression   . Hypertension   . Nephrolithiasis     Past Surgical History:  Procedure Laterality Date  . CARDIOVERSION N/A 11/30/2014   Procedure: CARDIOVERSION;  Surgeon: Chrystie Nose, MD;  Location: [facility] ENDOSCOPY;  Service: Cardiovascular;  Laterality: N/A;  09:19 elective cardioversion with propofol anesthesia,  @ 150 joules from Afib to SR with occassional PVC  . CHOLECYSTECTOMY       Current Outpatient Prescriptions  Medication Sig Dispense Refill  . acetaminophen (TYLENOL) 500 MG tablet Take 500 mg by mouth every 6 (six) hours as needed for mild pain or moderate pain.     Marland Kitchen diltiazem (CARDIZEM CD) 240 MG 24 hr capsule Take 1 capsule (240 mg total) by mouth daily. 90 capsule 3  . ELIQUIS 5 MG TABS tablet TAKE 1 TABLET (5 MG TOTAL) BY MOUTH 2 (TWO) TIMES DAILY. 60 tablet 5  . Omega-3 Fatty Acids (FISH OIL) 1000 MG CAPS Take 1,000 mg by mouth.    . Probiotic Product (ALIGN PO) Take 1 tablet by mouth daily.     Marland Kitchen triamterene-hydrochlorothiazide (MAXZIDE-25) 37.5-25 MG per tablet Take 1 tablet by mouth daily.   3    No current facility-administered medications for this visit.     Allergies:   Patient has no known allergies.     ROS:  Please see the history of present illness.   Otherwise, review of systems are positive for positive for back pain none. Otherwise as stated in the history of present illness and negative for all other systems..   All other systems are reviewed and negative.    PHYSICAL EXAM: VS:  BP 130/90   Pulse 85   Ht 6' (1.829 m)   Wt 250 lb (113.4 kg)   BMI 33.91 kg/m  , BMI Body mass index is 33.91 kg/m.  GENERAL:  Well appearing NECK:  No jugular venous distention, waveform within normal limits, carotid upstroke brisk and symmetric, no bruits, no thyromegaly LUNGS:  Clear to auscultation bilaterally CHEST:  Unremarkable HEART:  PMI not displaced or sustained,S1 and S2 within normal limits, no S3, no clicks, no rubs, no murmurs ABD:  Flat, positive bowel sounds normal in frequency in pitch, no bruits, no rebound, no guarding, no midline pulsatile mass, no hepatomegaly, no splenomegaly EXT:  2 plus pulses throughout, no edema, no cyanosis no clubbing  EKG:  EKG is  ordered today. Atrial fibrillation, rate 85, axis within normal limits, intervals within normal limits, low voltage in the limb leads.  Recent Labs: No results found for requested labs within last 8760 hours.      Wt Readings from Last 3 Encounters:  11/19/16 250 lb (113.4 kg)  05/21/16 257 lb 3.2 oz (116.7 kg)  05/03/15 253 lb (114.8 kg)      Other studies Reviewed: Additional studies/ records that were reviewed today include:  None Review of the above records demonstrates:    ASSESSMENT AND PLAN:  Atrial fib: Gary Bean has a CHA2DS2 - VASc score of 3 with a risk of stroke of 3.2% .  No change in therapy is planned.    He will continue with meds as listed.  Cardiomyopathy:    He has had a mildly reduced ejection fraction.  I will follow this clinically.   HTN:  The blood pressure is  at target. No change in medications is indicated. We will continue with therapeutic lifestyle changes (TLC).  Current medicines are reviewed at length with the patient today.  The patient does not have concerns regarding medicines.  The following changes have been made:  None  Labs/ tests ordered today include:  None  Orders Placed This Encounter  Procedures  . EKG 12-Lead     Disposition:   FU with me in 12 months  Signed, Rollene Rotunda, MD  11/19/2016 9:51 AM    [facility]

## 2016-11-19 ENCOUNTER — Encounter: Payer: Self-pay | Admitting: Cardiology

## 2016-11-19 ENCOUNTER — Ambulatory Visit (INDEPENDENT_AMBULATORY_CARE_PROVIDER_SITE_OTHER): Payer: Medicare Other | Admitting: Cardiology

## 2016-11-19 VITALS — BP 130/90 | HR 85 | Ht 72.0 in | Wt 250.0 lb

## 2016-11-19 DIAGNOSIS — I482 Chronic atrial fibrillation: Secondary | ICD-10-CM | POA: Diagnosis not present

## 2016-11-19 DIAGNOSIS — I4821 Permanent atrial fibrillation: Secondary | ICD-10-CM

## 2016-11-19 DIAGNOSIS — I428 Other cardiomyopathies: Secondary | ICD-10-CM

## 2016-11-19 NOTE — Patient Instructions (Signed)

## 2017-03-09 ENCOUNTER — Other Ambulatory Visit: Payer: Self-pay | Admitting: Cardiology

## 2017-05-12 ENCOUNTER — Other Ambulatory Visit: Payer: Self-pay | Admitting: Cardiology

## 2017-07-26 ENCOUNTER — Emergency Department (HOSPITAL_COMMUNITY): Payer: Medicare Other

## 2017-07-26 ENCOUNTER — Observation Stay (HOSPITAL_COMMUNITY)
Admission: EM | Admit: 2017-07-26 | Discharge: 2017-07-28 | Disposition: A | Discharge status: Home / Self Care | Payer: Medicare Other | Attending: Internal Medicine | Admitting: Internal Medicine

## 2017-07-26 ENCOUNTER — Encounter (HOSPITAL_COMMUNITY): Payer: Self-pay

## 2017-07-26 ENCOUNTER — Other Ambulatory Visit: Payer: Self-pay

## 2017-07-26 DIAGNOSIS — E876 Hypokalemia: Secondary | ICD-10-CM | POA: Diagnosis not present

## 2017-07-26 DIAGNOSIS — R42 Dizziness and giddiness: Secondary | ICD-10-CM | POA: Diagnosis not present

## 2017-07-26 DIAGNOSIS — Z7901 Long term (current) use of anticoagulants: Secondary | ICD-10-CM | POA: Insufficient documentation

## 2017-07-26 DIAGNOSIS — E860 Dehydration: Secondary | ICD-10-CM | POA: Diagnosis present

## 2017-07-26 DIAGNOSIS — I4891 Unspecified atrial fibrillation: Secondary | ICD-10-CM | POA: Diagnosis not present

## 2017-07-26 DIAGNOSIS — F32A Depression, unspecified: Secondary | ICD-10-CM

## 2017-07-26 DIAGNOSIS — I10 Essential (primary) hypertension: Secondary | ICD-10-CM | POA: Insufficient documentation

## 2017-07-26 DIAGNOSIS — F419 Anxiety disorder, unspecified: Secondary | ICD-10-CM

## 2017-07-26 DIAGNOSIS — R55 Syncope and collapse: Principal | ICD-10-CM | POA: Insufficient documentation

## 2017-07-26 DIAGNOSIS — Z79899 Other long term (current) drug therapy: Secondary | ICD-10-CM | POA: Diagnosis not present

## 2017-07-26 DIAGNOSIS — N179 Acute kidney failure, unspecified: Secondary | ICD-10-CM | POA: Diagnosis present

## 2017-07-26 DIAGNOSIS — F329 Major depressive disorder, single episode, unspecified: Secondary | ICD-10-CM

## 2017-07-26 DIAGNOSIS — R2689 Other abnormalities of gait and mobility: Secondary | ICD-10-CM | POA: Diagnosis not present

## 2017-07-26 DIAGNOSIS — M6281 Muscle weakness (generalized): Secondary | ICD-10-CM | POA: Diagnosis not present

## 2017-07-26 DIAGNOSIS — I959 Hypotension, unspecified: Secondary | ICD-10-CM | POA: Diagnosis present

## 2017-07-26 DIAGNOSIS — Z87891 Personal history of nicotine dependence: Secondary | ICD-10-CM | POA: Insufficient documentation

## 2017-07-26 HISTORY — DX: Dizziness and giddiness: R42

## 2017-07-26 LAB — URINALYSIS, ROUTINE W REFLEX MICROSCOPIC
BILIRUBIN URINE: NEGATIVE
GLUCOSE, UA: NEGATIVE mg/dL
HGB URINE DIPSTICK: NEGATIVE
KETONES UR: NEGATIVE mg/dL
LEUKOCYTES UA: NEGATIVE
Nitrite: NEGATIVE
PROTEIN: 30 mg/dL — AB
Specific Gravity, Urine: 1.016 (ref 1.005–1.030)
pH: 5 (ref 5.0–8.0)

## 2017-07-26 LAB — BASIC METABOLIC PANEL
Anion gap: 13 (ref 5–15)
BUN: 29 mg/dL — AB (ref 6–20)
CHLORIDE: 96 mmol/L — AB (ref 101–111)
CO2: 27 mmol/L (ref 22–32)
Calcium: 9.7 mg/dL (ref 8.9–10.3)
Creatinine, Ser: 1.54 mg/dL — ABNORMAL HIGH (ref 0.61–1.24)
GFR calc Af Amer: 47 mL/min — ABNORMAL LOW (ref >60)
GFR calc non Af Amer: 41 mL/min — ABNORMAL LOW (ref >60)
Glucose, Bld: 160 mg/dL — ABNORMAL HIGH (ref 65–99)
POTASSIUM: 3.4 mmol/L — AB (ref 3.5–5.1)
Sodium: 136 mmol/L (ref 135–145)

## 2017-07-26 LAB — CBC
HEMATOCRIT: 49.9 % (ref 39.0–52.0)
Hemoglobin: 17.2 g/dL — ABNORMAL HIGH (ref 13.0–17.0)
MCH: 31.2 pg (ref 26.0–34.0)
MCHC: 34.5 g/dL (ref 30.0–36.0)
MCV: 90.4 fL (ref 78.0–100.0)
Platelets: 198 10*3/uL (ref 150–400)
RBC: 5.52 MIL/uL (ref 4.22–5.81)
RDW: 13.5 % (ref 11.5–15.5)
WBC: 13.3 10*3/uL — ABNORMAL HIGH (ref 4.0–10.5)

## 2017-07-26 LAB — I-STAT TROPONIN, ED: Troponin i, poc: 0.01 ng/mL (ref 0.00–0.08)

## 2017-07-26 LAB — CBG MONITORING, ED: Glucose-Capillary: 126 mg/dL — ABNORMAL HIGH (ref 65–99)

## 2017-07-26 MED ORDER — SODIUM CHLORIDE 0.9 % IV BOLUS (SEPSIS)
1000.0000 mL | Freq: Once | INTRAVENOUS | Status: AC
  Administered 2017-07-26: 1000 mL via INTRAVENOUS

## 2017-07-26 NOTE — ED Provider Notes (Signed)
[facility] EMERGENCY DEPARTMENT Provider Note   CSN: [id number] Arrival date & time: 07/26/17  1308     History   Chief Complaint Chief Complaint  Patient presents with  . Loss of Consciousness    HPI Gary Bean is a 83 y.o. male with history of atrial fibrillation on Cardizem, hypertension here for evaluation of possible syncopal episode at [place] [facility]. Patient has had cold-like symptoms since Friday including chills, fever, fatigue, decreased appetite, decreased by mouth intake, rhinorrhea, cough with phlegm. Went to PCP today for this. Has been taking Tylenol and over-the-counter cold indication with minimal relief. Wife was with patient during syncopal episode and states they were walking out the doctor's office when the patient looked very pale and clammy, he started to go down but his wife helped him down to the floor. There was no actual fall or head trauma. Patient did not completely lose consciousness. Patient does not remember the episode but states now he feels a little clammy but other wise just fine. PCP gave him water to drink and 8 ounces of yogurt, and told to come to ED.  Has not had anything else to eat or drink all day. Per triage note, blood pressure was 80/40 after syncopal episode. Wife states PCP thinks he is dehydrated. Per PCP note at bedside, suspect viral syndrome and dehydration recommending IV hydration.    HPI  Past Medical History:  Diagnosis Date  . Anxiety   . Atrial fibrillation (HCC)   . Depression   . Hypertension   . Nephrolithiasis     Patient Active Problem List   Diagnosis Date Noted  . Atrial fibrillation, unspecified   . Encounter for cardioversion procedure   . Atrial fibrillation (HCC) 11/16/2014  . Nonischemic cardiomyopathy (HCC) 11/16/2014    Past Surgical History:  Procedure Laterality Date  . CARDIOVERSION N/A 11/30/2014   Procedure: CARDIOVERSION;  Surgeon: Chrystie Nose, MD;  Location: [facility]  ENDOSCOPY;  Service: Cardiovascular;  Laterality: N/A;  09:19 elective cardioversion with propofol anesthesia,  @ 150 joules from Afib to SR with occassional PVC  . CHOLECYSTECTOMY         Home Medications    Prior to Admission medications   Medication Sig Start Date End Date Taking? Authorizing Provider  acetaminophen (TYLENOL) 500 MG tablet Take 1,000 mg by mouth every 6 (six) hours as needed for mild pain, moderate pain or headache.    Yes [provider]  Chlorpheniramine-APAP (CORICIDIN) 2-325 MG TABS Take 1 tablet by mouth every 6 (six) hours as needed (cold/flu symptoms).   Yes [provider]  diltiazem (CARDIZEM CD) 240 MG 24 hr capsule TAKE 1 CAPSULE (240 MG TOTAL) BY MOUTH DAILY. 05/12/17  Yes Hochrein, Fayrene Fearing, MD  ELIQUIS 5 MG TABS tablet TAKE 1 TABLET (5 MG TOTAL) BY MOUTH 2 (TWO) TIMES DAILY. 03/09/17  Yes Rollene Rotunda, MD  Omega-3 Fatty Acids (FISH OIL) 1000 MG CAPS Take 1,000 mg by mouth daily.    Yes [provider]  Probiotic Product (ALIGN PO) Take 1 tablet by mouth daily.    Yes [provider]  Tetrahydrozoline HCl (VISINE OP) Place 1 drop into both eyes daily as needed (dry eyes).   Yes [provider]  triamterene-hydrochlorothiazide (MAXZIDE-25) 37.5-25 MG per tablet Take 1 tablet by mouth daily.  11/05/14  Yes [provider]    Family History Family History  Problem Relation Age of Onset  . CAD Father 50  died  . Goiter Mother   . Prostate cancer Brother   . Rectal cancer Sister 12    Social History Social History   Tobacco Use  . Smoking status: Former Smoker    Packs/day: 0.50    Years: 20.00    Pack years: 10.00    Types: Cigarettes  . Smokeless tobacco: Never Used  . Tobacco comment: Quit 2003  Substance Use Topics  . Alcohol use: Not on file  . Drug use: Not on file     Allergies   Patient has no known allergies.   Review of Systems Review of Systems  Constitutional:  Positive for fatigue.     Physical Exam Updated Vital Signs BP (!) 148/94   Pulse 89   Temp 98.3 F (36.8 C) (Oral)   Resp 17   Ht 6' (1.829 m)   Wt 107.5 kg (237 lb)   SpO2 98%   BMI 32.14 kg/m   Physical Exam  Constitutional: He is oriented to person, place, and time. He appears well-developed and well-nourished. No distress.  NAD.  HENT:  Head: Normocephalic and atraumatic.  Right Ear: External ear normal.  Left Ear: External ear normal.  Nose: Mucosal edema present.  Mouth/Throat: Mucous membranes are dry. Posterior oropharyngeal erythema present.  Dry mucous membranes. Oropharynx is erythematous with postnasal drip noted. Mild mucosal edema. No sinus tenderness.  Eyes: Conjunctivae and EOM are normal. No scleral icterus.  Neck: Normal range of motion. Neck supple.  Nontender bilateral submandibular lymphadenopathy. No nuchal rigidity.  Cardiovascular: Normal rate, regular rhythm, normal heart sounds and intact distal pulses.  No murmur heard. 2+ DP and radial pulses bilaterally. No lower extremity edema or calf tenderness.  Pulmonary/Chest: Effort normal and breath sounds normal. He has no wheezes.  Abdominal: Soft. There is no tenderness.  No suprapubic or CVA tenderness. No pulsatility.  Musculoskeletal: Normal range of motion. He exhibits no deformity.  Moving all 4 extremities spontaneously without discomfort or difficulty.  Neurological: He is alert and oriented to person, place, and time.  Alert and oriented to self, place, time and event.  Speech is fluent without aphasia. Strength 5/5 with hand grip and ankle F/E.   Sensation to light touch intact in hands and feet. Normal gait. No pronator drift.  Normal finger-to-nose and heel-to-shin test.  CN I and VIII not tested. CN II-XII intact bilaterally.   Skin: Skin is warm and dry. Capillary refill takes less than 2 seconds.  Psychiatric: He has a normal mood and affect. His behavior is normal. Judgment and  thought content normal.  Nursing note and vitals reviewed.    ED Treatments / Results  Labs (all labs ordered are listed, but only abnormal results are displayed) Labs Reviewed  BASIC METABOLIC PANEL - Abnormal; Notable for the following components:      Result Value   Potassium 3.4 (*)    Chloride 96 (*)    Glucose, Bld 160 (*)    BUN 29 (*)    Creatinine, Ser 1.54 (*)    GFR calc non Af Amer 41 (*)    GFR calc Af Amer 47 (*)    All other components within normal limits  CBC - Abnormal; Notable for the following components:   WBC 13.3 (*)    Hemoglobin 17.2 (*)    All other components within normal limits  URINALYSIS, ROUTINE W REFLEX MICROSCOPIC - Abnormal; Notable for the following components:   Color, Urine AMBER (*)    APPearance CLOUDY (*)  Protein, ur 30 (*)    Bacteria, UA RARE (*)    Squamous Epithelial / LPF 0-5 (*)    All other components within normal limits  CBG MONITORING, ED - Abnormal; Notable for the following components:   Glucose-Capillary 126 (*)    All other components within normal limits  I-STAT TROPONIN, ED  CBG MONITORING, ED    EKG  EKG Interpretation None       Radiology Dg Chest 2 View  Result Date: 07/26/2017 CLINICAL DATA:  Coughing congestion over the last several weeks. Syncopal episode with hypotension. EXAM: CHEST  2 VIEW COMPARISON:  None. FINDINGS: Heart size upper limits of normal. There is aortic atherosclerosis. The pulmonary vascularity is normal. Lungs are clear. No effusions. Ordinary degenerative changes affect the spine. IMPRESSION: No active cardiopulmonary disease. Electronically Signed   By: Paulina Fusi M.D.   On: 07/26/2017 20:37    Procedures Procedures (including critical care time)  Medications Ordered in ED Medications  sodium chloride 0.9 % bolus 1,000 mL (0 mLs Intravenous Stopped 07/26/17 2223)     Initial Impression / Assessment and Plan / ED Course  I have reviewed the triage vital signs and the  nursing notes.  Pertinent labs & imaging results that were available during my care of the patient were reviewed by me and considered in my medical decision making (see chart for details).  Clinical Course as of Jul 26 2238  Mon Jul 26, 2017  2045 WBC: (!) 13.3 [CG]  2046 Creatinine: (!) 1.54 [CG]  2046 WBC: (!) 13.3 [CG]  2047 BUN: (!) 29 [CG]  2047 GFR, Est Non African American: (!) 41 [CG]    Clinical Course User Index [CG] Liberty Handy, PA-C    Symptomatology, exam, most consistent with dehydration in setting of viral illness.  Given age, risk, h/o a-fib also considering cardiac etiology although lower on differential.  He has no risk factors for PE and he's on Eloquis. No abdominal/back pain or pulse deficits. Doubt AAA, dissection, central cause. Will get labs, IVF to rehydrate. Low threshold for admission for observation for dehydration in this patient given age, risk and poor PO intake.   Final Clinical Impressions(s) / ED Diagnoses   Patient with mild AKI. Given age, risk, decreased by mouth intake, presyncopal episode will request admission for observation. Patient will benefit from IV hydration and telemetry. Final diagnoses:  Near syncope  AKI (acute kidney injury) [facility])    ED Discharge Orders    None       Jerrell Mylar 07/26/17 2249    Margarita Grizzle, MD 07/27/17 [phone]

## 2017-07-26 NOTE — H&P (Signed)
PCP:   Blair Heys, MD  Cardiology: Dr Montel Clock  Chief Complaint:  presyncope  HPI:  this is a 83 y/o gentleman who Friday had rhinnohea. On Saturday had a low grade fever. On Saturday and Sunday he felt clammy and did not eat or drink much. He denies any myalgia. He denies any  SOB, cough,or wheeze. He denies nausea, vomiting or diarrhea. He denies any aches or pain. He did get his flu shot this year. He denies any sick contacts. He denies burning on urination. He saw his PCP today, who agreed patient was dehydrated.Rapid flu done in office was negative.  On leaving his PCP's office at elevator the patient became pale, lightheaded and dizzy. He felt presyncopal.  He returned to the MD office and he was sent to the ER.  Review of Systems:  The patient denies anorexia, fever, weight loss, rhinnorhea, vision loss, decreased hearing, hoarseness, chest pain, syncope, dyspnea on exertion, peripheral edema, balance deficits, hemoptysis, abdominal pain, melena, hematochezia, severe indigestion/heartburn, hematuria, incontinence, genital sores, muscle weakness, suspicious skin lesions, transient blindness, difficulty walking, depression, unusual weight change, abnormal bleeding, enlarged lymph nodes, angioedema, and breast masses.  Past Medical History: Past Medical History:  Diagnosis Date  . Anxiety   . Atrial fibrillation (HCC)   . Depression   . Hypertension   . Nephrolithiasis    Past Surgical History:  Procedure Laterality Date  . CARDIOVERSION N/A 11/30/2014   Procedure: CARDIOVERSION;  Surgeon: Chrystie Nose, MD;  Location: [facility] ENDOSCOPY;  Service: Cardiovascular;  Laterality: N/A;  09:19 elective cardioversion with propofol anesthesia,  @ 150 joules from Afib to SR with occassional PVC  . CHOLECYSTECTOMY      Medications: Prior to Admission medications   Medication Sig Start Date End Date Taking? Authorizing Provider  acetaminophen (TYLENOL) 500 MG tablet Take 1,000 mg by mouth  every 6 (six) hours as needed for mild pain, moderate pain or headache.    Yes [provider]  Chlorpheniramine-APAP (CORICIDIN) 2-325 MG TABS Take 1 tablet by mouth every 6 (six) hours as needed (cold/flu symptoms).   Yes [provider]  diltiazem (CARDIZEM CD) 240 MG 24 hr capsule TAKE 1 CAPSULE (240 MG TOTAL) BY MOUTH DAILY. 05/12/17  Yes Hochrein, Fayrene Fearing, MD  ELIQUIS 5 MG TABS tablet TAKE 1 TABLET (5 MG TOTAL) BY MOUTH 2 (TWO) TIMES DAILY. 03/09/17  Yes Rollene Rotunda, MD  Omega-3 Fatty Acids (FISH OIL) 1000 MG CAPS Take 1,000 mg by mouth daily.    Yes [provider]  Probiotic Product (ALIGN PO) Take 1 tablet by mouth daily.    Yes [provider]  Tetrahydrozoline HCl (VISINE OP) Place 1 drop into both eyes daily as needed (dry eyes).   Yes [provider]  triamterene-hydrochlorothiazide (MAXZIDE-25) 37.5-25 MG per tablet Take 1 tablet by mouth daily.  11/05/14  Yes [provider]    Allergies:  No Known Allergies  Social History:  reports that he has quit smoking. His smoking use included cigarettes. He has a 10.00 pack-year smoking history. he has never used smokeless tobacco. His alcohol and drug histories are not on file.  Family History: Family History  Problem Relation Age of Onset  . CAD Father 48       died  . Goiter Mother   . Prostate cancer Brother   . Rectal cancer Sister 50    Physical Exam: Vitals:   07/26/17 1448 07/26/17 1449 07/26/17 1848 07/26/17 1852  BP: 102/71  Marland Kitchen)  148/94   Pulse: (!) 114  89   Resp: 18  17   Temp: 98.3 F (36.8 C)     TempSrc: Oral     SpO2: 99%  98% 98%  Weight:  107.5 kg (237 lb)    Height:  6' (1.829 m)      General:  Alert and oriented times three, well developed and nourished, no acute distress, weak Eyes: PERRLA, pink conjunctiva, no scleral icterus ENT: dry oral mucosa, neck supple, no thyromegaly Lungs: clear to ascultation, no wheeze, no crackles, no use of  accessory muscles Cardiovascular: regular rate and rhythm, no regurgitation, no gallops, no murmurs. No carotid bruits, no JVD Abdomen: soft, positive BS, non-tender, non-distended, no organomegaly, not an acute abdomen GU: not examined Neuro: CN II - XII grossly intact, sensation intact Musculoskeletal: strength 5/5 all extremities, no clubbing, cyanosis or edema Skin: no rash, no subcutaneous crepitation, no decubitus Psych: appropriate patient   Labs on Admission:  Recent Labs    07/26/17 1450  NA 136  K 3.4*  CL 96*  CO2 27  GLUCOSE 160*  BUN 29*  CREATININE 1.54*  CALCIUM 9.7   No results for input(s): AST, ALT, ALKPHOS, BILITOT, PROT, ALBUMIN in the last 72 hours. No results for input(s): LIPASE, AMYLASE in the last 72 hours. Recent Labs    07/26/17 1450  WBC 13.3*  HGB 17.2*  HCT 49.9  MCV 90.4  PLT 198   No results for input(s): CKTOTAL, CKMB, CKMBINDEX, TROPONINI in the last 72 hours. Invalid input(s): POCBNP No results for input(s): DDIMER in the last 72 hours. No results for input(s): HGBA1C in the last 72 hours. No results for input(s): CHOL, HDL, LDLCALC, TRIG, CHOLHDL, LDLDIRECT in the last 72 hours. No results for input(s): TSH, T4TOTAL, T3FREE, THYROIDAB in the last 72 hours.  Invalid input(s): FREET3 No results for input(s): VITAMINB12, FOLATE, FERRITIN, TIBC, IRON, RETICCTPCT in the last 72 hours.  Micro Results: No results found for this or any previous visit (from the past 240 hour(s)).   Radiological Exams on Admission: Dg Chest 2 View  Result Date: 07/26/2017 CLINICAL DATA:  Coughing congestion over the last several weeks. Syncopal episode with hypotension. EXAM: CHEST  2 VIEW COMPARISON:  None. FINDINGS: Heart size upper limits of normal. There is aortic atherosclerosis. The pulmonary vascularity is normal. Lungs are clear. No effusions. Ordinary degenerative changes affect the spine. IMPRESSION: No active cardiopulmonary disease.  Electronically Signed   By: Paulina Fusi M.D.   On: 07/26/2017 20:37    Assessment/Plan Present on Admission: . Dehydration/presyncope -bring in for 23hour observation on med tele -IVF hydration -orthostatic vitals in AM  . Hypotension -see above. Hold parameters on blood pressure medications  . Atrial fibrillation (HCC) -stable, home meds resumed  Viral illness -symptomatic treatmebnt  . Hypokalemia -replete with PO potassium, BMP in AM  . AKI (acute kidney injury) (HCC) -IVF hydration   Yee Gangi 07/26/2017, 10:55 PM

## 2017-07-26 NOTE — ED Triage Notes (Signed)
Per Pt, Pt is coming from [facility] where he was seen and evaluated for fatigue, cough, and congestion x several weeks. While leaving office, pt had a syncopal episode and was noted to have Bp of 80/40. Pt was given food and something to drink and then told to come be evaluated here. Pt is alert and oriented x4 upon arrival. Reports generalized fatigue and cough. Denies CP or SOB. Productive cough with green sputum noted.

## 2017-07-27 ENCOUNTER — Other Ambulatory Visit: Payer: Self-pay

## 2017-07-27 ENCOUNTER — Encounter (HOSPITAL_COMMUNITY): Payer: Self-pay | Admitting: General Practice

## 2017-07-27 DIAGNOSIS — R42 Dizziness and giddiness: Secondary | ICD-10-CM | POA: Diagnosis not present

## 2017-07-27 DIAGNOSIS — R55 Syncope and collapse: Secondary | ICD-10-CM | POA: Diagnosis not present

## 2017-07-27 HISTORY — DX: Dizziness and giddiness: R42

## 2017-07-27 LAB — CBC
HCT: 44.2 % (ref 39.0–52.0)
Hemoglobin: 15.1 g/dL (ref 13.0–17.0)
MCH: 30.4 pg (ref 26.0–34.0)
MCHC: 34.2 g/dL (ref 30.0–36.0)
MCV: 88.9 fL (ref 78.0–100.0)
PLATELETS: 162 10*3/uL (ref 150–400)
RBC: 4.97 MIL/uL (ref 4.22–5.81)
RDW: 13.4 % (ref 11.5–15.5)
WBC: 9.1 10*3/uL (ref 4.0–10.5)

## 2017-07-27 LAB — BASIC METABOLIC PANEL
Anion gap: 12 (ref 5–15)
BUN: 35 mg/dL — ABNORMAL HIGH (ref 6–20)
CALCIUM: 8.7 mg/dL — AB (ref 8.9–10.3)
CO2: 21 mmol/L — ABNORMAL LOW (ref 22–32)
CREATININE: 2.04 mg/dL — AB (ref 0.61–1.24)
Chloride: 104 mmol/L (ref 101–111)
GFR, EST AFRICAN AMERICAN: 34 mL/min — AB (ref >60)
GFR, EST NON AFRICAN AMERICAN: 29 mL/min — AB (ref >60)
Glucose, Bld: 126 mg/dL — ABNORMAL HIGH (ref 65–99)
Potassium: 3.3 mmol/L — ABNORMAL LOW (ref 3.5–5.1)
SODIUM: 137 mmol/L (ref 135–145)

## 2017-07-27 LAB — MAGNESIUM: MAGNESIUM: 2 mg/dL (ref 1.7–2.4)

## 2017-07-27 MED ORDER — SODIUM CHLORIDE 0.9 % IV SOLN
INTRAVENOUS | Status: DC
  Administered 2017-07-27 – 2017-07-28 (×4): via INTRAVENOUS

## 2017-07-27 MED ORDER — POTASSIUM CHLORIDE CRYS ER 20 MEQ PO TBCR
40.0000 meq | EXTENDED_RELEASE_TABLET | Freq: Once | ORAL | Status: AC
  Administered 2017-07-27: 40 meq via ORAL
  Filled 2017-07-27: qty 2

## 2017-07-27 MED ORDER — APIXABAN 5 MG PO TABS
5.0000 mg | ORAL_TABLET | Freq: Two times a day (BID) | ORAL | Status: DC
  Administered 2017-07-27: 5 mg via ORAL
  Filled 2017-07-27: qty 1

## 2017-07-27 MED ORDER — ENOXAPARIN SODIUM 30 MG/0.3ML ~~LOC~~ SOLN: 30.0000 mg | SUBCUTANEOUS | Status: DC

## 2017-07-27 MED ORDER — APIXABAN 2.5 MG PO TABS
2.5000 mg | ORAL_TABLET | Freq: Two times a day (BID) | ORAL | Status: DC
  Administered 2017-07-27 – 2017-07-28 (×2): 2.5 mg via ORAL
  Filled 2017-07-27 (×2): qty 1

## 2017-07-27 NOTE — ED Notes (Signed)
Attempted report x1. 

## 2017-07-27 NOTE — Evaluation (Signed)
Physical Therapy Evaluation and Discharge Patient Details Name: Gary Bean MRN: [id number] DOB: 18-May-1935 Today's Date: 07/27/2017   History of Present Illness  Pt is an 83 y/o male admitted secondary to dizziness and syncopal episode. PMH includes a fib and HTN.   Clinical Impression  Patient evaluated by Physical Therapy with no further acute PT needs identified. All education has been completed and the patient has no further questions. Pt asymptomatic throughout treatment and steady throughout gait. Was independent to supervision level with all mobility. Pt reports he is at baseline with mobility.  See below for any follow-up Physical Therapy or equipment needs. PT is signing off. Thank you for this referral. If needs change. Please re-consult.      Follow Up Recommendations No PT follow up    Equipment Recommendations  None recommended by PT    Recommendations for Other Services       Precautions / Restrictions Precautions Precautions: None Restrictions Weight Bearing Restrictions: No      Mobility  Bed Mobility Overal bed mobility: Independent                Transfers Overall transfer level: Independent               General transfer comment: Pt asymptomatic throughout transfer.   Ambulation/Gait Ambulation/Gait assistance: Supervision Ambulation Distance (Feet): 150 Feet Assistive device: None Gait Pattern/deviations: WFL(Within Functional Limits) Gait velocity: WFL  Gait velocity interpretation: at or above normal speed for age/gender General Gait Details: Overall steady gait. Able to perfrom dynamic gait tasks without LOB. Asymptomatic throughout gait.   Stairs Stairs: (Pt reports he doesn't feel he will have issues with steps)          Wheelchair Mobility    Modified Rankin (Stroke Patients Only)       Balance Overall balance assessment: Needs assistance Sitting-balance support: No upper extremity supported;Feet  supported Sitting balance-Leahy Scale: Normal     Standing balance support: No upper extremity supported;During functional activity Standing balance-Leahy Scale: Good                   Standardized Balance Assessment Standardized Balance Assessment : Dynamic Gait Index   Dynamic Gait Index Level Surface: Normal Change in Gait Speed: Normal Gait with Horizontal Head Turns: Normal Gait with Vertical Head Turns: Normal Step Around Obstacles: Normal       Pertinent Vitals/Pain Pain Assessment: No/denies pain    Home Living Family/patient expects to be discharged to:: Private residence Living Arrangements: Spouse/significant other Available Help at Discharge: Family Type of Home: House Home Access: Stairs to enter Entrance Stairs-Rails: None Secretary/administrator of Steps: 2 Home Layout: One level Home Equipment: None      Prior Function Level of Independence: Independent         Comments: Still works in Microbiologist   Dominant Hand: Right    Extremity/Trunk Assessment   Upper Extremity Assessment Upper Extremity Assessment: Overall WFL for tasks assessed    Lower Extremity Assessment Lower Extremity Assessment: Overall WFL for tasks assessed    Cervical / Trunk Assessment Cervical / Trunk Assessment: Normal  Communication   Communication: No difficulties  Cognition Arousal/Alertness: Awake/alert Behavior During Therapy: WFL for tasks assessed/performed Overall Cognitive Status: Within Functional Limits for tasks assessed  General Comments General comments (skin integrity, edema, etc.): Pt reports he feels he is at baseline with mobility and has no further skilled PT needs.    Exercises     Assessment/Plan    PT Assessment Patent does not need any further PT services  PT Problem List         PT Treatment Interventions      PT Goals (Current goals can be found in  the Care Plan section)  Acute Rehab PT Goals Patient Stated Goal: to go home PT Goal Formulation: With patient Time For Goal Achievement: 07/27/17 Potential to Achieve Goals: Good    Frequency     Barriers to discharge        Co-evaluation               AM-PAC PT "6 Clicks" Daily Activity  Outcome Measure Difficulty turning over in bed (including adjusting bedclothes, sheets and blankets)?: None Difficulty moving from lying on back to sitting on the side of the bed? : None Difficulty sitting down on and standing up from a chair with arms (e.g., wheelchair, bedside commode, etc,.)?: None Help needed moving to and from a bed to chair (including a wheelchair)?: None Help needed walking in hospital room?: None Help needed climbing 3-5 steps with a railing? : A Little 6 Click Score: 23    End of Session Equipment Utilized During Treatment: Gait belt Activity Tolerance: Patient tolerated treatment well Patient left: in bed;with call bell/phone within reach;with bed alarm set;with family/visitor present Nurse Communication: Mobility status PT Visit Diagnosis: Other abnormalities of gait and mobility (R26.89)    Time: 2122-4825 PT Time Calculation (min) (ACUTE ONLY): 23 min   Charges:   PT Evaluation $PT Eval Low Complexity: 1 Low PT Treatments $Gait Training: 8-22 mins   PT G Codes:        Gladys Damme, PT, DPT  Acute Rehabilitation Services  Pager: [phone]   Lehman Prom 07/27/2017, 1:07 PM

## 2017-07-27 NOTE — ED Notes (Signed)
Pt/Cg states that he does not have pacemaker

## 2017-07-27 NOTE — Progress Notes (Signed)
PROGRESS NOTE    Patient: Gary Bean     PCP: Blair Heys, MD                    DOB: 1935-02-16            DOA: 07/26/2017 [id number]             DOS: 07/27/2017, 10:49 AM   Date of Service: the patient was seen and examined on 07/27/2017 Subjective:   Patient was seen and examined this morning, no acute distress.  Denies any shortness of breath or chest pain.  Denies any dizziness headaches or visual changes.   ----------------------------------------------------------------------------------------------------------------------  Brief Narrative:   this is a 83 y/o gentleman who Friday had rhinnohea. On Saturday had a low grade fever. On Saturday and Sunday he felt clammy and did not eat or drink much. He denies any myalgia. He denies any  SOB, cough,or wheeze. He denies nausea, vomiting or diarrhea. He denies any aches or pain. He did get his flu shot this year. He denies any sick contacts. He denies burning on urination. He saw his PCP today, who agreed patient was dehydrated.Rapid flu done in office was negative.  On leaving his PCP's office at elevator the patient became pale, lightheaded and dizzy. He felt presyncopal      Principal Problem:   Postural dizziness with presyncope Active Problems:   Atrial fibrillation (HCC)   Dehydration   Hypotension   Anxiety and depression   Hypokalemia   AKI (acute kidney injury) (HCC)   Assessment & Plan:   Dehydration/presyncope - Much improved symptoms, currently denies any headaches visual changes.  Denies any dizziness  Continue with observation on telemetry floor. We will continue with gentle IV fluid hydration -orthostatic vitals, PT/OT evaluation  . Hypotension -see above. Hold parameters on blood pressure medications  . Atrial fibrillation (HCC) -stable, home meds resumed  Viral illness -symptomatic treatmebnt  . Hypokalemia -replete with PO potassium, BMP in AM  . AKI (acute kidney injury)  (HCC) -IVF hydration    DVT prophylaxis:      SCDs/compression stockings    Eliquis  Code Status:         Full code  Family Communication:  The above findings and plan of care has been discussed with patient , he expressed understanding and agreement of above.   Disposition Plan:  1-2 days, Home               Consultants:   None   Procedures:  No admission procedures for hospital encounter.    Antimicrobials:  Anti-infectives (From admission, onward)   None       Objective: Vitals:   07/27/17 0900 07/27/17 0915 07/27/17 0930 07/27/17 1030  BP: (!) 144/97  136/75 (!) 140/96  Pulse: (!) 106 98 90 98  Resp: 15 16 16    Temp:      TempSrc:      SpO2: 98% 96% 98% 99%  Weight:      Height:        Intake/Output Summary (Last 24 hours) at 07/27/2017 1049 Last data filed at 07/27/2017 1039 Gross per 24 hour  Intake 2000 ml  Output 700 ml  Net 1300 ml   Filed Weights   07/26/17 1449  Weight: 107.5 kg (237 lb)    Examination:  General exam: Appears calm and comfortable  Respiratory system: Clear to auscultation. Respiratory effort normal. Cardiovascular system: S1 & S2 heard, RRR. No JVD, murmurs, rubs,  gallops or clicks. No pedal edema. Gastrointestinal system: Abdomen is nondistended, soft and nontender. No organomegaly or masses felt. Normal bowel sounds heard. Central nervous system: Alert and oriented. No focal neurological deficits. Extremities: Symmetric 5 x 5 power. Skin: No rashes, lesions or ulcers Psychiatry: Judgement and insight appear normal. Mood & affect appropriate.     Data Reviewed: I have personally reviewed following labs and imaging studies  CBC: Recent Labs  Lab 07/26/17 1450 07/27/17 0900  WBC 13.3* 9.1  HGB 17.2* 15.1  HCT 49.9 44.2  MCV 90.4 88.9  PLT 198 162   Basic Metabolic Panel: Recent Labs  Lab 07/26/17 1450 07/27/17 0900  NA 136 137  K 3.4* 3.3*  CL 96* 104  CO2 27 21*  GLUCOSE 160* 126*  BUN 29* 35*   CREATININE 1.54* 2.04*  CALCIUM 9.7 8.7*  MG  --  2.0    Recent Labs  Lab 07/26/17 2049  GLUCAP 126*     Radiology Studies: Dg Chest 2 View  Result Date: 07/26/2017 CLINICAL DATA:  Coughing congestion over the last several weeks. Syncopal episode with hypotension. EXAM: CHEST  2 VIEW COMPARISON:  None. FINDINGS: Heart size upper limits of normal. There is aortic atherosclerosis. The pulmonary vascularity is normal. Lungs are clear. No effusions. Ordinary degenerative changes affect the spine. IMPRESSION: No active cardiopulmonary disease. Electronically Signed   By: Paulina Fusi M.D.   On: 07/26/2017 20:37    Scheduled Meds: . apixaban  5 mg Oral BID   Continuous Infusions: . sodium chloride 100 mL/hr at 07/27/17 0348     LOS: 0 days    Time spent: >25 minutes   Kendell Bane, MD [facility] Pager [phone]  If 7PM-7AM, please contact night-coverage [email or web address] Password [facility] 07/27/2017, 10:49 AM

## 2017-07-27 NOTE — ED Notes (Signed)
Heart Healthy Diet was ordered for Lunch. 

## 2017-07-28 ENCOUNTER — Observation Stay (HOSPITAL_BASED_OUTPATIENT_CLINIC_OR_DEPARTMENT_OTHER): Payer: Medicare Other

## 2017-07-28 DIAGNOSIS — E860 Dehydration: Secondary | ICD-10-CM

## 2017-07-28 DIAGNOSIS — F329 Major depressive disorder, single episode, unspecified: Secondary | ICD-10-CM | POA: Diagnosis not present

## 2017-07-28 DIAGNOSIS — I959 Hypotension, unspecified: Secondary | ICD-10-CM

## 2017-07-28 DIAGNOSIS — I482 Chronic atrial fibrillation: Secondary | ICD-10-CM

## 2017-07-28 DIAGNOSIS — E876 Hypokalemia: Secondary | ICD-10-CM | POA: Diagnosis not present

## 2017-07-28 DIAGNOSIS — N179 Acute kidney failure, unspecified: Secondary | ICD-10-CM

## 2017-07-28 DIAGNOSIS — F419 Anxiety disorder, unspecified: Secondary | ICD-10-CM | POA: Diagnosis not present

## 2017-07-28 DIAGNOSIS — R42 Dizziness and giddiness: Secondary | ICD-10-CM | POA: Diagnosis not present

## 2017-07-28 DIAGNOSIS — R55 Syncope and collapse: Secondary | ICD-10-CM

## 2017-07-28 LAB — BASIC METABOLIC PANEL
ANION GAP: 13 (ref 5–15)
Anion gap: 10 (ref 5–15)
BUN: 27 mg/dL — ABNORMAL HIGH (ref 6–20)
BUN: 23 mg/dL — ABNORMAL HIGH (ref 6–20)
CHLORIDE: 103 mmol/L (ref 101–111)
CHLORIDE: 104 mmol/L (ref 101–111)
CO2: 25 mmol/L (ref 22–32)
CO2: 26 mmol/L (ref 22–32)
CREATININE: 1.25 mg/dL — AB (ref 0.61–1.24)
Calcium: 8.7 mg/dL — ABNORMAL LOW (ref 8.9–10.3)
Calcium: 8.6 mg/dL — ABNORMAL LOW (ref 8.9–10.3)
Creatinine, Ser: 1.55 mg/dL — ABNORMAL HIGH (ref 0.61–1.24)
GFR, EST AFRICAN AMERICAN: 47 mL/min — AB (ref >60)
GFR, EST NON AFRICAN AMERICAN: 40 mL/min — AB (ref >60)
GFR, EST NON AFRICAN AMERICAN: 52 mL/min — AB (ref >60)
Glucose, Bld: 122 mg/dL — ABNORMAL HIGH (ref 65–99)
Glucose, Bld: 130 mg/dL — ABNORMAL HIGH (ref 65–99)
POTASSIUM: 2.9 mmol/L — AB (ref 3.5–5.1)
POTASSIUM: 3.3 mmol/L — AB (ref 3.5–5.1)
Sodium: 141 mmol/L (ref 135–145)
Sodium: 140 mmol/L (ref 135–145)

## 2017-07-28 MED ORDER — POTASSIUM CHLORIDE 20 MEQ PO PACK
40.0000 meq | PACK | Freq: Once | ORAL | Status: AC
  Filled 2017-07-28: qty 2

## 2017-07-28 MED ORDER — POTASSIUM CHLORIDE CRYS ER 20 MEQ PO TBCR
40.0000 meq | EXTENDED_RELEASE_TABLET | Freq: Two times a day (BID) | ORAL | Status: AC
Start: 2017-07-28 — End: 2017-07-28
  Administered 2017-07-28 (×2): 40 meq via ORAL
  Filled 2017-07-28 (×2): qty 2

## 2017-07-28 MED ORDER — POTASSIUM CHLORIDE 20 MEQ PO PACK
40.0000 meq | PACK | Freq: Once | ORAL | Status: AC
  Administered 2017-07-28: 40 meq via ORAL
  Filled 2017-07-28: qty 2

## 2017-07-28 MED ORDER — DILTIAZEM HCL ER COATED BEADS 240 MG PO CP24
240.0000 mg | ORAL_CAPSULE | Freq: Every day | ORAL | Status: DC
  Administered 2017-07-28: 240 mg via ORAL
  Filled 2017-07-28: qty 1

## 2017-07-28 NOTE — Care Management Obs Status (Signed)
MEDICARE OBSERVATION STATUS NOTIFICATION   Patient Details  Name: KAMAREN DEADMOND MRN: [id number] Date of Birth: 01-24-1935   Medicare Observation Status Notification Given:  Yes    Lawerance Sabal, RN 07/28/2017, 9:22 AM

## 2017-07-28 NOTE — Progress Notes (Signed)
*  PRELIMINARY RESULTS* Vascular Ultrasound Carotid Duplex (Doppler) has been completed.  Preliminary findings: Bilateral ICA's are consistent with a 1-39% stenosis. Somewhat difficult evaluation of plaque due to tissue attenuation and shadowing.  Bilateral vertebral arteries were patent with antegrade flow.    Chauncey Fischer 07/28/2017, 10:54 AM

## 2017-07-28 NOTE — Evaluation (Signed)
Occupational Therapy Evaluation Patient Details Name: Gary Bean MRN: [id number] DOB: [birth year] Today's Date: 07/28/2017    History of Present Illness Pt is an 83 y/o male admitted secondary to dizziness and syncopal episode. PMH includes a fib and HTN.    Clinical Impression   PTA, pt was living with his wife and was independent. Currently, pt performing for ADLs and functional mobility at Mod I level with increased time. Pt performing grooming, bathing, and LB dressing and presenting near baselie function. Educated pt and wife on BEFAST for stroke signs and symptoms; pt and wife verbalized understanding. Answered all pt questions. Recommend dc home once medically stable per physician. All acute OT needs met and will sign off. Thank you.     Follow Up Recommendations  No OT follow up;Supervision - Intermittent    Equipment Recommendations  None recommended by OT    Recommendations for Other Services       Precautions / Restrictions Precautions Precautions: None Restrictions Weight Bearing Restrictions: No      Mobility Bed Mobility Overal bed mobility: Independent                Transfers Overall transfer level: Independent                    Balance Overall balance assessment: Needs assistance Sitting-balance support: No upper extremity supported;Feet supported Sitting balance-Leahy Scale: Normal     Standing balance support: No upper extremity supported;During functional activity Standing balance-Leahy Scale: Good                             ADL either performed or assessed with clinical judgement   ADL Overall ADL's : Modified independent                                       General ADL Comments: Pt performing grooming, LB dressing, and bathing at Mod I level with increased time. Pt near baselien function stating he is feelign better today. Educated wife and pt on BEFAST for stroke signs and symptoms.      Vision Baseline Vision/History: Wears glasses Wears Glasses: At all times Patient Visual Report: No change from baseline       Perception     Praxis      Pertinent Vitals/Pain Pain Assessment: No/denies pain     Hand Dominance Right   Extremity/Trunk Assessment Upper Extremity Assessment Upper Extremity Assessment: Overall WFL for tasks assessed   Lower Extremity Assessment Lower Extremity Assessment: Overall WFL for tasks assessed   Cervical / Trunk Assessment Cervical / Trunk Assessment: Normal   Communication Communication Communication: No difficulties   Cognition Arousal/Alertness: Awake/alert Behavior During Therapy: WFL for tasks assessed/performed Overall Cognitive Status: Within Functional Limits for tasks assessed                                     General Comments  Wife present during session    Exercises     Shoulder Instructions      Home Living Family/patient expects to be discharged to:: Private residence Living Arrangements: Spouse/significant other Available Help at Discharge: Family Type of Home: House Home Access: Stairs to enter Technical brewer of Steps: 2 Entrance Stairs-Rails: None Home Layout: One level     Bathroom  Shower/Tub: Teacher, early years/pre: Standard     Home Equipment: None          Prior Functioning/Environment Level of Independence: Independent        Comments: Still works in Social worker Problem List: Decreased activity tolerance;Impaired balance (sitting and/or standing)      OT Treatment/Interventions:      OT Goals(Current goals can be found in the care plan section) Acute Rehab OT Goals Patient Stated Goal: to go home OT Goal Formulation: All assessment and education complete, DC therapy  OT Frequency:     Barriers to D/C:            Co-evaluation              AM-PAC PT "6 Clicks" Daily Activity     Outcome Measure Help from another  person eating meals?: None Help from another person taking care of personal grooming?: None Help from another person toileting, which includes using toliet, bedpan, or urinal?: None Help from another person bathing (including washing, rinsing, drying)?: None Help from another person to put on and taking off regular upper body clothing?: None Help from another person to put on and taking off regular lower body clothing?: None 6 Click Score: 24   End of Session Equipment Utilized During Treatment: Gait belt Nurse Communication: Mobility status;Other (comment)(HR)  Activity Tolerance: Patient tolerated treatment well Patient left: in bed;with call bell/phone within reach;with family/visitor present  OT Visit Diagnosis: Muscle weakness (generalized) (M62.81)                Time: 6438-3779 OT Time Calculation (min): 18 min Charges:  OT General Charges $OT Visit: 1 Visit OT Evaluation $OT Eval Low Complexity: 1 Low G-Codes:     Siyon Linck MSOT, OTR/L Acute Rehab Pager: [phone] Office: [place] 07/28/2017, 9:29 AM

## 2017-07-28 NOTE — Progress Notes (Signed)
Discharge instructions reviewed with pt and wife.  Copy of instructions given to pt.  Pt d/c'd via wheelchair with belongings, with wife.       Escorted by unit NT.

## 2017-07-28 NOTE — Discharge Summary (Signed)
Physician Discharge Summary  Gary Bean JSE:[id number] DOB: 05/27/1935 DOA: 07/26/2017  PCP: Blair Heys, MD  Admit date: 07/26/2017 Discharge date: 07/28/2017  Time spent: 45 minutes  Recommendations for Outpatient Follow-up:  Patient will be discharged to home.  Patient will need to follow up with primary care provider within one week of discharge, repeat BMP.  Patient should continue medications as prescribed.  Patient should follow a heart healthy diet.  Hold Maxide until follow up with your PCP.  Discharge Diagnoses:  Dehydration/Presyncopal episode Hypotension Atrial fibrillation Possible viral illness Hypokalemia Acute kidney injury  Discharge Condition: Stable  Diet recommendation: heart healthy  Filed Weights   07/26/17 1449  Weight: 107.5 kg (237 lb)    History of present illness:  On 07/26/2017 by Dr. Si Raider this is a 83 y/o gentleman who Friday had rhinnohea. On Saturday had a low grade fever. On Saturday and Sunday he felt clammy and did not eat or drink much. He denies any myalgia. He denies any  SOB, cough,or wheeze. He denies nausea, vomiting or diarrhea. He denies any aches or pain. He did get his flu shot this year. He denies any sick contacts. He denies burning on urination. He saw his PCP today, who agreed patient was dehydrated.Rapid flu done in office was negative.  On leaving his PCP's office at elevator the patient became pale, lightheaded and dizzy. He felt presyncopal.  He returned to the MD office and he was sent to the ER.  Hospital Course:  Dehydration/Presyncopal episode -Patient no longer complaining of lightheadedness or dizziness. -Suspect patient had a viral illness causing him to be dehydrated, poor oral intake -Patient was given gentle IV fluids -orthostatic vitals unremarkable -Carotid doppler: 1-39% bilateral ICA stenosis.  Somewhat difficult evaluation of plaque due to tissue attenuation and shadowing.  Bilateral vertebral  arteries are patent with antegrade flow.  Hypotension -Maxzide and cardizem held -Reviewing patient's chart BP was noted to be as low as 80/40 -Blood Pressure has since improved  Atrial fibrillation -CHADSVASC at least 3 (based on age, h/o HTN) -Currently rate controlled -Continue Eliquis -restarted cardizem  Possible viral illness -Patient was checked for rapid flu and his primary care physician's office which was unremarkable -Continue supportive care  Hypokalemia -Continue to replace, currently up to 3.3 -was given additional prior to discharge -magnesium 2 -repeat BMP in one week  Acute kidney injury -Called patient's PCP, last creatinine was 1.09 on 02/19/2017 -Creatinine peaked at 2.04, currently down to 1.25 -Suspect due to dehydration and poor oral intake -Repeat BMP in one week  Procedures: None  Consultations: None  Discharge Exam: Vitals:   07/28/17 0907 07/28/17 1247  BP: (!) 138/94 (!) 132/91  Pulse: 83 85  Resp: 16 18  Temp: 97.8 F (36.6 C) 97.7 F (36.5 C)  SpO2: 96% 99%   No longer complaining of dizziness or lightheadedness.  Denies current chest pain, shortness of breath, abdominal pain, nausea or vomiting, diarrhea or constipation.  General: Well developed, well nourished, NAD, appears stated age  HEENT: NCAT,  mucous membranes moist.  Cardiovascular: S1 S2 auscultated, irregular  Respiratory: Clear to auscultation bilaterally with equal chest rise  Abdomen: Soft, nontender, nondistended, + bowel sounds  Extremities: warm dry without cyanosis clubbing or edema  Neuro: AAOx3, nonfocal  Psych: Normal affect and demeanor with intact judgement and insight, pleasant  Discharge Instructions Discharge Instructions    Discharge instructions   Complete by:  As directed    Patient will be discharged  to home.  Patient will need to follow up with primary care provider within one week of discharge, repeat BMP.  Patient should continue  medications as prescribed.  Patient should follow a heart healthy diet.     Allergies as of 07/28/2017   No Known Allergies     Medication List    STOP taking these medications   triamterene-hydrochlorothiazide 37.5-25 MG tablet Commonly known as:  MAXZIDE-25     TAKE these medications   acetaminophen 500 MG tablet Commonly known as:  TYLENOL Take 1,000 mg by mouth every 6 (six) hours as needed for mild pain, moderate pain or headache.   ALIGN PO Take 1 tablet by mouth daily.   CORICIDIN 2-325 MG Tabs Generic drug:  Chlorpheniramine-APAP Take 1 tablet by mouth every 6 (six) hours as needed (cold/flu symptoms).   diltiazem 240 MG 24 hr capsule Commonly known as:  CARDIZEM CD TAKE 1 CAPSULE (240 MG TOTAL) BY MOUTH DAILY.   ELIQUIS 5 MG Tabs tablet Generic drug:  apixaban TAKE 1 TABLET (5 MG TOTAL) BY MOUTH 2 (TWO) TIMES DAILY.   Fish Oil 1000 MG Caps Take 1,000 mg by mouth daily.   VISINE OP Place 1 drop into both eyes daily as needed (dry eyes).      No Known Allergies    The results of significant diagnostics from this hospitalization (including imaging, microbiology, ancillary and laboratory) are listed below for reference.    Significant Diagnostic Studies: Dg Chest 2 View  Result Date: 07/26/2017 CLINICAL DATA:  Coughing congestion over the last several weeks. Syncopal episode with hypotension. EXAM: CHEST  2 VIEW COMPARISON:  None. FINDINGS: Heart size upper limits of normal. There is aortic atherosclerosis. The pulmonary vascularity is normal. Lungs are clear. No effusions. Ordinary degenerative changes affect the spine. IMPRESSION: No active cardiopulmonary disease. Electronically Signed   By: Paulina Fusi M.D.   On: 07/26/2017 20:37   Vas US Carotid  Result Date: 07/28/2017 Carotid Arterial Duplex Study Indications: Syncope. Examination Guidelines: A complete evaluation includes B-mode imaging, spectral doppler, color doppler, and power doppler as needed of  all accessible portions of each vessel. Bilateral testing is considered an integral part of a complete examination. Limited examinations for reoccurring indications may be performed as noted.  Right Carotid Findings: +----------+--------+--------+--------+--------+--------+           PSV cm/sEDV cm/sStenosisDescribeComments +----------+--------+--------+--------+--------+--------+ CCA Prox  74      19                               +----------+--------+--------+--------+--------+--------+ CCA Distal-68     -19                              +----------+--------+--------+--------+--------+--------+ ICA Prox  -49     -21                              +----------+--------+--------+--------+--------+--------+ ICA Distal-61     -29                              +----------+--------+--------+--------+--------+--------+ ECA       -69     -9                               +----------+--------+--------+--------+--------+--------+ +----------+--------+-------+--------+-------------------+  PSV cm/sEDV cmsDescribeArm Pressure (mmHG) +----------+--------+-------+--------+-------------------+ [id number]                                         +----------+--------+-------+--------+-------------------+ +---------+--------+---+--------+---+---------+ VertebralPSV cm/s-30EDV cm/s-13Antegrade +---------+--------+---+--------+---+---------+  Left Carotid Findings: +----------+--------+--------+--------+--------+--------+           PSV cm/sEDV cm/sStenosisDescribeComments +----------+--------+--------+--------+--------+--------+ CCA Prox  113     29                               +----------+--------+--------+--------+--------+--------+ CCA Distal-63     -17                              +----------+--------+--------+--------+--------+--------+ ICA Prox  -53     -20                               +----------+--------+--------+--------+--------+--------+ ICA Distal-63     -31                              +----------+--------+--------+--------+--------+--------+ ECA       -72     -19                              +----------+--------+--------+--------+--------+--------+ +----------+--------+--------+--------------+-------------------+ SubclavianPSV cm/sEDV cm/sDescribe      Arm Pressure (mmHG) +----------+--------+--------+--------------+-------------------+                           Not identified                    +----------+--------+--------+--------------+-------------------+ +---------+--------+---+--------+---+---------+ VertebralPSV cm/s-30EDV cm/s-13Antegrade +---------+--------+---+--------+---+---------+  Final Interpretation: Right Carotid: Velocities in the right ICA are consistent with a 1-39% stenosis.                Somewhat difficult evaluation due to tissue attenuation and                shadowing. Left Carotid: Velocities in the left ICA are consistent with a 1-39% stenosis.               Somewhat difficult evaluation due to tissue attenuation and               shadowing. Vertebrals: Both vertebral arteries were patent with antegrade flow. *See table(s) above for measurements and observations.  Electronically signed by Delia Heady on 07/28/2017 at 12:41:50 PM.    Microbiology: No results found for this or any previous visit (from the past 240 hour(s)).   Labs: Basic Metabolic Panel: Recent Labs  Lab 07/26/17 1450 07/27/17 0900 07/28/17 0713 07/28/17 1531  NA 136 137 141 140  K 3.4* 3.3* 2.9* 3.3*  CL 96* 104 103 104  CO2 27 21* 25 26  GLUCOSE 160* 126* 122* 130*  BUN 29* 35* 27* 23*  CREATININE 1.54* 2.04* 1.55* 1.25*  CALCIUM 9.7 8.7* 8.7* 8.6*  MG  --  2.0  --   --    Liver Function Tests: No results for input(s): AST, ALT, ALKPHOS, BILITOT, PROT, ALBUMIN in the last 168 hours. No results for input(s): LIPASE, AMYLASE in the last  168 hours. No results for input(s): AMMONIA  in the last 168 hours. CBC: Recent Labs  Lab 07/26/17 1450 07/27/17 0900  WBC 13.3* 9.1  HGB 17.2* 15.1  HCT 49.9 44.2  MCV 90.4 88.9  PLT 198 162   Cardiac Enzymes: No results for input(s): CKTOTAL, CKMB, CKMBINDEX, TROPONINI in the last 168 hours. BNP: BNP (last 3 results) No results for input(s): BNP in the last 8760 hours.  ProBNP (last 3 results) No results for input(s): PROBNP in the last 8760 hours.  CBG: Recent Labs  Lab 07/26/17 2049  GLUCAP 126*       Signed:  Georgean Spainhower  [facility] 07/28/2017, 4:23 PM

## 2017-08-06 ENCOUNTER — Encounter (HOSPITAL_COMMUNITY): Payer: Self-pay | Admitting: *Deleted

## 2017-08-06 ENCOUNTER — Other Ambulatory Visit: Payer: Self-pay

## 2017-08-06 ENCOUNTER — Emergency Department (HOSPITAL_COMMUNITY): Payer: Medicare Other

## 2017-08-06 ENCOUNTER — Emergency Department (HOSPITAL_COMMUNITY)
Admission: EM | Admit: 2017-08-06 | Discharge: 2017-08-06 | Disposition: A | Discharge status: Home / Self Care | Payer: Medicare Other | Attending: Emergency Medicine | Admitting: Emergency Medicine

## 2017-08-06 DIAGNOSIS — R7989 Other specified abnormal findings of blood chemistry: Secondary | ICD-10-CM | POA: Diagnosis not present

## 2017-08-06 DIAGNOSIS — R5383 Other fatigue: Secondary | ICD-10-CM | POA: Insufficient documentation

## 2017-08-06 DIAGNOSIS — K5900 Constipation, unspecified: Secondary | ICD-10-CM | POA: Diagnosis not present

## 2017-08-06 DIAGNOSIS — Z87891 Personal history of nicotine dependence: Secondary | ICD-10-CM | POA: Insufficient documentation

## 2017-08-06 DIAGNOSIS — Z79899 Other long term (current) drug therapy: Secondary | ICD-10-CM | POA: Insufficient documentation

## 2017-08-06 DIAGNOSIS — R112 Nausea with vomiting, unspecified: Secondary | ICD-10-CM | POA: Diagnosis not present

## 2017-08-06 DIAGNOSIS — R1013 Epigastric pain: Secondary | ICD-10-CM | POA: Insufficient documentation

## 2017-08-06 DIAGNOSIS — I482 Chronic atrial fibrillation, unspecified: Secondary | ICD-10-CM

## 2017-08-06 DIAGNOSIS — I10 Essential (primary) hypertension: Secondary | ICD-10-CM | POA: Diagnosis not present

## 2017-08-06 DIAGNOSIS — R630 Anorexia: Secondary | ICD-10-CM | POA: Insufficient documentation

## 2017-08-06 LAB — COMPREHENSIVE METABOLIC PANEL
ALK PHOS: 71 U/L (ref 38–126)
ALT: 42 U/L (ref 17–63)
AST: 31 U/L (ref 15–41)
Albumin: 3.7 g/dL (ref 3.5–5.0)
Anion gap: 13 (ref 5–15)
BILIRUBIN TOTAL: 1.2 mg/dL (ref 0.3–1.2)
BUN: 17 mg/dL (ref 6–20)
CALCIUM: 8.9 mg/dL (ref 8.9–10.3)
CHLORIDE: 101 mmol/L (ref 101–111)
CO2: 24 mmol/L (ref 22–32)
CREATININE: 1.06 mg/dL (ref 0.61–1.24)
Glucose, Bld: 127 mg/dL — ABNORMAL HIGH (ref 65–99)
Potassium: 3.3 mmol/L — ABNORMAL LOW (ref 3.5–5.1)
Sodium: 138 mmol/L (ref 135–145)
Total Protein: 6.7 g/dL (ref 6.5–8.1)

## 2017-08-06 LAB — URINALYSIS, ROUTINE W REFLEX MICROSCOPIC
Bacteria, UA: NONE SEEN
Bilirubin Urine: NEGATIVE
Glucose, UA: NEGATIVE mg/dL
Ketones, ur: 5 mg/dL — AB
LEUKOCYTES UA: NEGATIVE
Nitrite: NEGATIVE
PH: 6 (ref 5.0–8.0)
Protein, ur: 30 mg/dL — AB
SPECIFIC GRAVITY, URINE: 1.019 (ref 1.005–1.030)

## 2017-08-06 LAB — CBC
HCT: 47.2 % (ref 39.0–52.0)
Hemoglobin: 16.1 g/dL (ref 13.0–17.0)
MCH: 30.8 pg (ref 26.0–34.0)
MCHC: 34.1 g/dL (ref 30.0–36.0)
MCV: 90.4 fL (ref 78.0–100.0)
PLATELETS: 187 10*3/uL (ref 150–400)
RBC: 5.22 MIL/uL (ref 4.22–5.81)
RDW: 13.2 % (ref 11.5–15.5)
WBC: 7.7 10*3/uL (ref 4.0–10.5)

## 2017-08-06 LAB — T4, FREE: FREE T4: 1.13 ng/dL — AB (ref 0.61–1.12)

## 2017-08-06 LAB — LIPASE, BLOOD: Lipase: 34 U/L (ref 11–51)

## 2017-08-06 LAB — TSH: TSH: 0.3 u[IU]/mL — ABNORMAL LOW (ref 0.350–4.500)

## 2017-08-06 LAB — I-STAT TROPONIN, ED: TROPONIN I, POC: 0 ng/mL (ref 0.00–0.08)

## 2017-08-06 MED ORDER — DILTIAZEM HCL ER COATED BEADS 240 MG PO CP24
240.0000 mg | ORAL_CAPSULE | Freq: Once | ORAL | Status: AC
  Administered 2017-08-06: 240 mg via ORAL
  Filled 2017-08-06: qty 1

## 2017-08-06 MED ORDER — SODIUM CHLORIDE 0.9 % IV BOLUS (SEPSIS)
500.0000 mL | Freq: Once | INTRAVENOUS | Status: AC
  Administered 2017-08-06: 500 mL via INTRAVENOUS

## 2017-08-06 MED ORDER — DILTIAZEM HCL 25 MG/5ML IV SOLN
15.0000 mg | Freq: Once | INTRAVENOUS | Status: AC
  Administered 2017-08-06: 15 mg via INTRAVENOUS
  Filled 2017-08-06: qty 5

## 2017-08-06 MED ORDER — IOPAMIDOL (ISOVUE-300) INJECTION 61%
INTRAVENOUS | Status: AC
  Administered 2017-08-06: 100 mL
  Filled 2017-08-06: qty 100

## 2017-08-06 MED ORDER — DILTIAZEM HCL 60 MG PO TABS
60.0000 mg | ORAL_TABLET | Freq: Once | ORAL | Status: AC
  Administered 2017-08-06: 60 mg via ORAL
  Filled 2017-08-06: qty 1

## 2017-08-06 NOTE — ED Triage Notes (Signed)
The pt is here because he just does not feel well.  He was in the hospital from feb  4th-6th for a virus.  He has not had a bm since feb 3rd  Intermittent vomiting  No appetite not sleeping

## 2017-08-06 NOTE — ED Provider Notes (Signed)
t [facility] EMERGENCY DEPARTMENT Provider Note   CSN: [id number] Arrival date & time: 08/06/17  1523     History   Chief Complaint Chief Complaint  Patient presents with  . Abdominal Pain    HPI Gary Bean is a 83 y.o. male.  HPI  Reports poor appetite, not eating or drinking, not sleeping, down in the dumps, depressed.  Epigastric tightness, no BM since 2/3.  Noticed tightness in epigastric area about 1 week ago, constant. Low appetite, not sure if worse with eating.  Had some jello and gingerale today.  Nausea for about one week.  Small amount of emesis, 3 times today, has had small amount daily.  No medication changes. Did not take his diltiazem this morning. No chest pain, no dyspnea, no lightheadedness, no palpitations.  Hx of abdominal surgeries.  Reports some difficulty urinating. No fevers.   Past Medical History:  Diagnosis Date  . Anxiety   . Atrial fibrillation (HCC)   . Depression   . Dizzy 07/27/2017  . Hypertension   . Nephrolithiasis     Patient Active Problem List   Diagnosis Date Noted  . Dehydration 07/26/2017  . Postural dizziness with presyncope 07/26/2017  . Hypotension 07/26/2017  . Anxiety and depression 07/26/2017  . Hypokalemia 07/26/2017  . AKI (acute kidney injury) (HCC) 07/26/2017  . Atrial fibrillation, unspecified   . Encounter for cardioversion procedure   . Atrial fibrillation (HCC) 11/16/2014  . Nonischemic cardiomyopathy (HCC) 11/16/2014    Past Surgical History:  Procedure Laterality Date  . CARDIOVERSION N/A 11/30/2014   Procedure: CARDIOVERSION;  Surgeon: Chrystie Nose, MD;  Location: [facility] ENDOSCOPY;  Service: Cardiovascular;  Laterality: N/A;  09:19 elective cardioversion with propofol anesthesia,  @ 150 joules from Afib to SR with occassional PVC  . CHOLECYSTECTOMY    . KIDNEY STONE SURGERY         Home Medications    Prior to Admission medications   Medication Sig Start Date End Date Taking?  Authorizing Provider  acetaminophen (TYLENOL) 500 MG tablet Take 1,000 mg by mouth every 6 (six) hours as needed for mild pain, moderate pain or headache.    Yes [provider]  Chlorpheniramine-APAP (CORICIDIN) 2-325 MG TABS Take 1 tablet by mouth every 6 (six) hours as needed (cold/flu symptoms).   Yes [provider]  diltiazem (CARDIZEM CD) 240 MG 24 hr capsule TAKE 1 CAPSULE (240 MG TOTAL) BY MOUTH DAILY. 05/12/17  Yes Hochrein, Fayrene Fearing, MD  ELIQUIS 5 MG TABS tablet TAKE 1 TABLET (5 MG TOTAL) BY MOUTH 2 (TWO) TIMES DAILY. 03/09/17  Yes Rollene Rotunda, MD  MELATONIN PO Take 1 tablet by mouth at bedtime.   Yes [provider]  Omega-3 Fatty Acids (FISH OIL) 1000 MG CAPS Take 1,000 mg by mouth daily.    Yes [provider]  Probiotic Product (ALIGN PO) Take 1 tablet by mouth daily.    Yes [provider]  Tetrahydrozoline HCl (VISINE OP) Place 1 drop into both eyes daily as needed (dry eyes).   Yes [provider]    Family History Family History  Problem Relation Age of Onset  . CAD Father 58       died  . Goiter Mother   . Prostate cancer Brother   . Rectal cancer Sister 73    Social History Social History   Tobacco Use  . Smoking status: Former Smoker    Packs/day: 0.50    Years: 20.00  Pack years: 10.00    Types: Cigarettes  . Smokeless tobacco: Never Used  . Tobacco comment: Quit 2003  Substance Use Topics  . Alcohol use: Yes    Alcohol/week: 0.0 oz    Comment: SOCIAL  . Drug use: No     Allergies   Patient has no known allergies.   Review of Systems Review of Systems  Constitutional: Positive for activity change, appetite change and fatigue. Negative for fever.  HENT: Negative for sore throat.   Eyes: Negative for visual disturbance.  Respiratory: Negative for cough and shortness of breath.   Cardiovascular: Negative for chest pain.  Gastrointestinal: Positive for abdominal pain, constipation, nausea and  vomiting. Negative for diarrhea.  Genitourinary: Positive for difficulty urinating.  Musculoskeletal: Negative for back pain and neck stiffness.  Skin: Negative for rash.  Neurological: Negative for syncope, light-headedness and headaches.     Physical Exam Updated Vital Signs BP (!) 152/107   Pulse 93   Temp 97.9 F (36.6 C) (Oral)   Resp 20   Ht 6' (1.829 m)   Wt 108.9 kg (240 lb)   SpO2 98%   BMI 32.55 kg/m   Physical Exam  Constitutional: He is oriented to person, place, and time. He appears well-developed and well-nourished. No distress.  HENT:  Head: Normocephalic and atraumatic.  Eyes: Conjunctivae and EOM are normal.  Neck: Normal range of motion.  Cardiovascular: Normal rate, regular rhythm, normal heart sounds and intact distal pulses. Exam reveals no gallop and no friction rub.  No murmur heard. Pulmonary/Chest: Effort normal and breath sounds normal. No respiratory distress. He has no wheezes. He has no rales.  Abdominal: Soft. He exhibits no distension. There is no tenderness. There is no guarding.  Musculoskeletal: He exhibits no edema.  Neurological: He is alert and oriented to person, place, and time.  Skin: Skin is warm and dry. He is not diaphoretic.  Nursing note and vitals reviewed.    ED Treatments / Results  Labs (all labs ordered are listed, but only abnormal results are displayed) Labs Reviewed  COMPREHENSIVE METABOLIC PANEL - Abnormal; Notable for the following components:      Result Value   Potassium 3.3 (*)    Glucose, Bld 127 (*)    All other components within normal limits  URINALYSIS, ROUTINE W REFLEX MICROSCOPIC - Abnormal; Notable for the following components:   Color, Urine AMBER (*)    Hgb urine dipstick SMALL (*)    Ketones, ur 5 (*)    Protein, ur 30 (*)    Squamous Epithelial / LPF 0-5 (*)    All other components within normal limits  TSH - Abnormal; Notable for the following components:   TSH 0.300 (*)    All other  components within normal limits  T4, FREE - Abnormal; Notable for the following components:   Free T4 1.13 (*)    All other components within normal limits  LIPASE, BLOOD  CBC  I-STAT TROPONIN, ED    EKG  EKG Interpretation  Date/Time:  Friday August 06 2017 18:07:18 EST Ventricular Rate:  120 PR Interval:    QRS Duration: 96 QT Interval:  328 QTC Calculation: 464 R Axis:   -34 Text Interpretation:  Atrial fibrillation Left axis deviation No significant change since last tracing Confirmed by Alvira Monday ([id number]) on 08/07/2017 1:25:17 AM       Radiology Ct Abdomen Pelvis W Contrast  Result Date: 08/06/2017 CLINICAL DATA:  Constipation and vomiting. EXAM: CT ABDOMEN AND  PELVIS WITH CONTRAST TECHNIQUE: Multidetector CT imaging of the abdomen and pelvis was performed using the standard protocol following bolus administration of intravenous contrast. CONTRAST:  ISOVUE-300 IOPAMIDOL (ISOVUE-300) INJECTION 61% COMPARISON:  None. FINDINGS: Lower chest: No acute abnormality. Hepatobiliary: No focal liver abnormality is seen. Status post cholecystectomy. No biliary dilatation. Pancreas: Unremarkable. No pancreatic ductal dilatation or surrounding inflammatory changes. Spleen: Normal in size without focal abnormality. Adrenals/Urinary Tract: The adrenal glands are unremarkable. 4.4 cm left renal cyst. Other subcentimeter exophytic low-density lesions arising from both kidneys are too small to characterize. Bilateral punctate nonobstructive renal calculi. No hydronephrosis. The bladder is underdistended. Stomach/Bowel: Stomach is within normal limits. Appendix appears normal. No evidence of bowel wall thickening, distention, or inflammatory changes. Vascular/Lymphatic: Aortic atherosclerosis. No enlarged abdominal or pelvic lymph nodes. Reproductive: Mild prostatomegaly. Other: No free fluid or pneumoperitoneum. Musculoskeletal: No acute or significant osseous findings. Degenerative changes  of the thoracolumbar spine. IMPRESSION: 1.  No acute intra-abdominal process. 2. Bilateral punctate nonobstructive nephrolithiasis. 3.  Aortic atherosclerosis (ICD10-I70.0). Electronically Signed   By: Obie Dredge M.D.   On: 08/06/2017 18:46    Procedures Procedures (including critical care time)  Medications Ordered in ED Medications  diltiazem (CARDIZEM) injection 15 mg (15 mg Intravenous Given 08/06/17 1811)  sodium chloride 0.9 % bolus 500 mL (0 mLs Intravenous Stopped 08/06/17 2016)  iopamidol (ISOVUE-300) 61 % injection (100 mLs  Contrast Given 08/06/17 1819)  diltiazem (CARDIZEM CD) 24 hr capsule 240 mg (240 mg Oral Given 08/06/17 1930)  diltiazem (CARDIZEM) tablet 60 mg (60 mg Oral Given 08/06/17 2052)     Initial Impression / Assessment and Plan / ED Course  I have reviewed the triage vital signs and the nursing notes.  Pertinent labs & imaging results that were available during my care of the patient were reviewed by me and considered in my medical decision making (see chart for details).     83 year old male with a history of hypertension, atrial fibrillation on eliquis, presents with concern for decreased appetite, fatigue, depression, nausea, vomiting and abdominal pain.  He was recently admitted to the hospital and discharged on 6 February for acute kidney injury likely secondary to dehydration in setting of viral illness.  Given patient reporting some epigastric abdominal pain, EKG and troponin were done which showed atrial fibrillation, and normal troponin.  Given patient's left lower quadrant tenderness on abdominal exam, CT was ordered to evaluate for diverticulitis or bowel obstruction and showed no evidence of acute intra-abdominal pathology.  Have low suspicion for mesenteric ischemia given patient on anticoagulation and do not feel history or physical exam is consistent with this.  Labs do not show any significant abnormalities, and his prior acute kidney injury has  resolved.  He was noted to have slightly decreased TSH, and increased free T4, and discussed that while his symptoms of difficulty sleeping, decreased appetite, and fatigue may be secondary to hyperthyroidism, given very mild deviation without other significant VS abnormalities I feel he is appropriate to follow up closely with repeat labs with his PCP and family agrees.  Patient reports he did not take his daily diltiazem today but due to feeling ill.  Suspect his atrial fibrillation with tachycardia today is likely secondary to mild dehydration as well as patient not location.  He was given a dose of IV diltiazem with initial improvement, and given his home dose of long-acting diltiazem, as well as a dose of short-acting oral diltiazem, with improvement of his heart rate at rest  in the room to the 90s.  He reports feeling improved.  He is stable for outpatient follow up of symptoms, recommend close PCP follow up for recheck TSH and discussion of hyperthyroid versus other etiologies of his fatigue.  Patient discharged in stable condition with understanding of reasons to return.    Final Clinical Impressions(s) / ED Diagnoses   Final diagnoses:  Other fatigue  Decreased appetite  Chronic atrial fibrillation (HCC)  Elevated TSH    ED Discharge Orders    None       Alvira Monday, MD 08/07/17 [phone]

## 2017-08-13 ENCOUNTER — Ambulatory Visit (HOSPITAL_COMMUNITY)
Admission: RE | Admit: 2017-08-13 | Discharge: 2017-08-13 | Disposition: A | Discharge status: Home / Self Care | Payer: Medicare Other | Attending: Psychiatry | Admitting: Psychiatry

## 2017-08-13 ENCOUNTER — Ambulatory Visit
Admission: RE | Admit: 2017-08-13 | Discharge: 2017-08-13 | Disposition: A | Discharge status: Home / Self Care | Payer: Medicare Other | Source: Ambulatory Visit | Attending: Family Medicine | Admitting: Family Medicine

## 2017-08-13 ENCOUNTER — Other Ambulatory Visit: Payer: Self-pay | Admitting: Family Medicine

## 2017-08-13 DIAGNOSIS — F331 Major depressive disorder, recurrent, moderate: Secondary | ICD-10-CM | POA: Diagnosis present

## 2017-08-13 DIAGNOSIS — I4891 Unspecified atrial fibrillation: Secondary | ICD-10-CM | POA: Insufficient documentation

## 2017-08-13 DIAGNOSIS — R059 Cough, unspecified: Secondary | ICD-10-CM

## 2017-08-13 DIAGNOSIS — R05 Cough: Secondary | ICD-10-CM

## 2017-08-13 DIAGNOSIS — Z9049 Acquired absence of other specified parts of digestive tract: Secondary | ICD-10-CM | POA: Insufficient documentation

## 2017-08-13 DIAGNOSIS — F419 Anxiety disorder, unspecified: Secondary | ICD-10-CM | POA: Diagnosis not present

## 2017-08-13 DIAGNOSIS — Z87891 Personal history of nicotine dependence: Secondary | ICD-10-CM | POA: Diagnosis not present

## 2017-08-13 DIAGNOSIS — I10 Essential (primary) hypertension: Secondary | ICD-10-CM | POA: Insufficient documentation

## 2017-08-13 NOTE — BH Assessment (Signed)
Assessment Note  Gary Bean is a 83 y.o. male. Patient presents voluntarily to [facility] for assessment accompanied by his wife, Gary Bean. The patient is cooperative and oriented x 4. His mood is depressed and his affect is somewhat withdrawn. He answers questions when asked with soft, slow speech. He endorses insomnia, poor appetite, loss of interest in usual pleasures (wife says pt hasn't watched tv in 22 days and he used to be an avid Warehouse manager of sports), isolating behavior and guilt. Pt denies SI currently or at any time in the past. Pt denies any history of suicide attempts and denies history of self-mutilation. Pt denies homicidal thoughts or physical aggression. Pt denies having access to firearms. Pt denies having any legal problems at this time. Pt denies any current or past substance abuse problems. Pt does not appear to be intoxicated or in withdrawal at this time. Pt denies hallucinations. Pt does not appear to be responding to internal stimuli and exhibits no delusional thought. Pt's reality testing appears to be intact.   Wife Gary Bean provides collateral info. She gives to Clinical research associate patient's appointment note he had prior to this assessment with his PCP [facility]. Writer made copy of patient's chart.  Ehinger recommends pt come to [facility] for assessment for depression. She says pt has had a virus and hasn't been able to eat or drink.. Per chart review, pt presented to [facility] for similar complaints on 08/06/17. On 07/26/17, pt presented to [facility] after syncopal episode and bp of 80/40. 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 She says that pt was "burned out". She says that pt was depressed after they got married in 1994. She reports pt's son and daughter in law are supportive. She reports pt has been on antidepressants for four days. Pt says he wasn't put on antidepressants when he experienced  depression in 1994.    Diagnosis: Major Depressive Disorder, Recurrent, Moderate  Past Medical History:  Past Medical History:  Diagnosis Date  . Anxiety   . Atrial fibrillation (HCC)   . Depression   . Dizzy 07/27/2017  . Hypertension   . Nephrolithiasis     Past Surgical History:  Procedure Laterality Date  . CARDIOVERSION N/A 11/30/2014   Procedure: CARDIOVERSION;  Surgeon: Chrystie Nose, MD;  Location: [facility] ENDOSCOPY;  Service: Cardiovascular;  Laterality: N/A;  09:19 elective cardioversion with propofol anesthesia,  @ 150 joules from Afib to SR with occassional PVC  . CHOLECYSTECTOMY    . KIDNEY STONE SURGERY      Family History:  Family History  Problem Relation Age of Onset  . CAD Father 61       died  . Goiter Mother   . Prostate cancer Brother   . Rectal cancer Sister 39    Social History:  reports that he has quit smoking. His smoking use included cigarettes. He has a 10.00 pack-year smoking history. he has never used smokeless tobacco. He reports that he drinks alcohol. He reports that he does not use drugs.  Additional Social History:  Alcohol / Drug Use Pain Medications: pt denies abuse - see pta meds list Prescriptions: pt denies abuse - see pta meds list Over the Counter: pt denies abuse - see pta meds list History of alcohol / drug use?: (pt rarely drinks etoh socially)  CIWA:   COWS:    Allergies: No Known Allergies  Home Medications:  (Not in a hospital admission)  OB/GYN Status:  No LMP for male patient.  General Assessment Data Location of Assessment: [facility] ED TTS Assessment: In system Is this a Tele or Face-to-Face Assessment?: Face-to-Face Is this an Initial Assessment or a Re-assessment for this encounter?: Initial Assessment Marital status: Married [place] name: Gary Bean Is patient pregnant?: No Pregnancy Status: No Living Arrangements: Spouse/significant other Can pt return to current living arrangement?: Yes Admission Status:  Voluntary Is patient capable of signing voluntary admission?: Yes Referral Source: Self/Family/Friend Insurance type: united healthcare medicare  Medical Screening Exam [facility] Walk-in ONLY) Medical Exam completed: Yes(shuvon rankin np)  Crisis Care Plan Living Arrangements: Spouse/significant other Name of Psychiatrist: none Name of Therapist: none  Education Status Is patient currently in school?: No Highest grade of school patient has completed: 12  Risk to self with the past 6 months Suicidal Ideation: No Has patient been a risk to self within the past 6 months prior to admission? : No Suicidal Intent: No Has patient had any suicidal intent within the past 6 months prior to admission? : No Is patient at risk for suicide?: No Suicidal Plan?: No Has patient had any suicidal plan within the past 6 months prior to admission? : No Access to Means: No What has been your use of drugs/alcohol within the last 12 months?: rarely drinks etoh Previous Attempts/Gestures: No How many times?: 0 Other Self Harm Risks: none Triggers for Past Attempts: (n/a) Intentional Self Injurious Behavior: None Family Suicide History: No Recent stressful life event(s): (n/a) Persecutory voices/beliefs?: No Depression: Yes Depression Symptoms: Loss of interest in usual pleasures, Isolating, Guilt, Fatigue, Insomnia Substance abuse history and/or treatment for substance abuse?: No Suicide prevention information given to non-admitted patients: Not applicable  Risk to Others within the past 6 months Homicidal Ideation: No Does patient have any lifetime risk of violence toward others beyond the six months prior to admission? : No Thoughts of Harm to Others: No Current Homicidal Intent: No Current Homicidal Plan: No Access to Homicidal Means: No Identified Victim: nonr History of harm to others?: No Assessment of Violence: None Noted Violent Behavior Description: pt denies abuse  Does patient have  access to weapons?: No Criminal Charges Pending?: No Does patient have a court date: No Is patient on probation?: No  Psychosis Hallucinations: None noted Delusions: None noted  Mental Status Report Appearance/Hygiene: Unremarkable Eye Contact: Fair Motor Activity: Freedom of movement Speech: Slow, Soft, Logical/coherent Level of Consciousness: Alert Mood: Depressed, Anhedonia Affect: Appropriate to circumstance, Depressed Anxiety Level: None Thought Processes: Relevant, Coherent Judgement: Unimpaired Orientation: Place, Person, Situation, Time Obsessive Compulsive Thoughts/Behaviors: None  Cognitive Functioning Concentration: Normal Memory: Remote Intact, Recent Intact IQ: Average Insight: Poor Impulse Control: Fair Appetite: Poor Sleep: Decreased Vegetative Symptoms: None  ADLScreening [facility] Assessment Services) Patient's cognitive ability adequate to safely complete daily activities?: Yes Patient able to express need for assistance with ADLs?: Yes Independently performs ADLs?: Yes (appropriate for developmental age)  Prior Inpatient Therapy Prior Inpatient Therapy: No  Prior Outpatient Therapy Prior Outpatient Therapy: Yes Prior Therapy Dates: in the distant past Prior Therapy Facilty/Provider(s): unknown Does patient have an ACCT team?: No Does patient have Intensive In-House Services?  : No Does patient have [facility] services? : No Does patient have P4CC services?: No  ADL Screening (condition at time of admission) Patient's cognitive ability adequate to safely complete daily activities?: Yes Is the patient deaf or have difficulty hearing?: No Does the patient have difficulty seeing, even  when wearing glasses/contacts?: No Does the patient have difficulty concentrating, remembering, or making decisions?: Yes Patient able to express need for assistance with ADLs?: Yes Does the patient have difficulty dressing or bathing?: No Independently performs ADLs?: Yes  (appropriate for developmental age) Does the patient have difficulty walking or climbing stairs?: No Weakness of Legs: None Weakness of Arms/Hands: None  Home Assistive Devices/Equipment Home Assistive Devices/Equipment: Eyeglasses    Abuse/Neglect Assessment (Assessment to be complete while patient is alone) Abuse/Neglect Assessment Can Be Completed: Yes Physical Abuse: Denies Verbal Abuse: Denies Sexual Abuse: Denies Exploitation of patient/patient's resources: Denies Self-Neglect: Denies     Merchant navy officer (For Healthcare) Does Patient Have a Medical Advance Directive?: No Would patient like information on creating a medical advance directive?: No - Patient declined    Additional Information 1:1 In Past 12 Months?: No CIRT Risk: No Elopement Risk: No Does patient have medical clearance?: No     Disposition:  Disposition Initial Assessment Completed for this Encounter: Yes Disposition of Patient: Outpatient treatment Type of outpatient treatment: Adult(shuvon rankin np recommends outpatient treatment)   Shuvon Rankin NP recommends patient seek outpatient mental health treatment as he does not meet inpatient psychiatric treatment. Writer provided outpatient mental health resources to pt and wife including [facility] outpatient clinic. Patient completed consent to release info and completed form placed in pt records charts.   On Site Evaluation by:   Reviewed with Physician:    Thornell Sartorius 08/13/2017 6:07 PM

## 2017-08-13 NOTE — H&P (Signed)
Behavioral Health Medical Screening Exam  Gary Bean is an 83 y.o. male patient presents as walk in at [facility]; brought in by his wife sent from PCP with complaints of fatigue, isolation, and several weeks of feeling tired and URI.  Patient states that has some depression but denies suicidal/self-harm/homicidal ideation, psychosis, and paranoia.  Patient PCP started on Zoloft 4 days ago and patient is tolerating medication without adverse reaction.  Decreased appetite, bowel movements, and urine output along with sore throat.  Recommend to follow up at ER or back to PCP  Total Time spent with patient: 30 minutes  Psychiatric Specialty Exam: Physical Exam  Vitals reviewed. Constitutional: He is oriented to person, place, and time. He appears well-developed.  Neck: Normal range of motion. Neck supple.  Respiratory: Effort normal.  Musculoskeletal: Normal range of motion.  Neurological: He is alert and oriented to person, place, and time.  Skin: Skin is warm and dry.    Review of Systems  Constitutional: Positive for malaise/fatigue.  HENT: Positive for congestion.        Complaints of sore throat making it painful when swallow  Respiratory: Positive for cough. Negative for shortness of breath and wheezing.   Cardiovascular: Positive for palpitations. Negative for chest pain.  Genitourinary: Positive for dysuria (somethime).  Skin: Negative.   Neurological: Positive for weakness.  Psychiatric/Behavioral: Positive for depression. Negative for hallucinations, memory loss, substance abuse and suicidal ideas. The patient is not nervous/anxious and does not have insomnia.     Blood pressure 114/73, pulse 60, temperature 97.9 F (36.6 C), resp. rate 16.There is no height or weight on file to calculate BMI.  General Appearance: Casual  Eye Contact:  Fair  Speech:  Normal Rate  Volume:  Decreased  Mood:  Depressed  Affect:  Appropriate  Thought Process:  Coherent and Goal Directed   Orientation:  Full (Time, Place, and Person)  Thought Content:  Logical  Suicidal Thoughts:  No  Homicidal Thoughts:  No  Memory:  Immediate;   Good Recent;   Good Remote;   Fair  Judgement:  Intact  Insight:  Present  Psychomotor Activity:  Decreased  Concentration: Concentration: Good and Attention Span: Good  Recall:  Good  Fund of Knowledge:Good  Language: Good  Akathisia:  No  Handed:  Right  AIMS (if indicated):     Assets:  Communication Skills Desire for Improvement Housing Social Support Transportation  Sleep:       Musculoskeletal: Strength & Muscle Tone: within normal limits Gait & Station: normal Patient leans: N/A  Blood pressure 114/73, pulse 60, temperature 97.9 F (36.6 C), resp. rate 16.  Recommendations:  Give resources for outpatient psychiatric services; referred to ER or PCP relate to sore throat; and decreased bowel/urine output, and pain with urination at times.    Based on my evaluation the patient does not appear to have an emergency medical condition.  Tarick Parenteau, NP 08/13/2017, 6:36 PM

## 2017-08-26 ENCOUNTER — Other Ambulatory Visit: Payer: Self-pay | Admitting: Family Medicine

## 2017-08-26 ENCOUNTER — Ambulatory Visit
Admission: RE | Admit: 2017-08-26 | Discharge: 2017-08-26 | Disposition: A | Discharge status: Home / Self Care | Payer: Medicare Other | Source: Ambulatory Visit | Attending: Family Medicine | Admitting: Family Medicine

## 2017-08-26 DIAGNOSIS — R4182 Altered mental status, unspecified: Secondary | ICD-10-CM

## 2017-08-27 ENCOUNTER — Ambulatory Visit: Payer: Medicare Other | Admitting: Cardiology

## 2017-08-30 ENCOUNTER — Telehealth: Payer: Self-pay | Admitting: Cardiology

## 2017-08-30 NOTE — Telephone Encounter (Signed)
Closed Encounter  °

## 2017-09-06 ENCOUNTER — Ambulatory Visit: Payer: Medicare Other | Admitting: Cardiology

## 2017-09-06 ENCOUNTER — Other Ambulatory Visit: Payer: Self-pay | Admitting: Cardiology

## 2017-09-08 ENCOUNTER — Encounter: Payer: Self-pay | Admitting: Cardiology

## 2017-09-13 ENCOUNTER — Telehealth (HOSPITAL_COMMUNITY): Payer: Self-pay | Admitting: Psychiatry

## 2017-09-13 ENCOUNTER — Encounter (HOSPITAL_COMMUNITY): Payer: Self-pay | Admitting: Psychiatry

## 2017-09-13 ENCOUNTER — Ambulatory Visit (INDEPENDENT_AMBULATORY_CARE_PROVIDER_SITE_OTHER): Payer: Medicare Other | Admitting: Psychiatry

## 2017-09-13 VITALS — BP 132/80 | HR 66 | Ht 72.0 in | Wt 204.0 lb

## 2017-09-13 DIAGNOSIS — G4700 Insomnia, unspecified: Secondary | ICD-10-CM | POA: Diagnosis not present

## 2017-09-13 DIAGNOSIS — F419 Anxiety disorder, unspecified: Secondary | ICD-10-CM

## 2017-09-13 DIAGNOSIS — Z87891 Personal history of nicotine dependence: Secondary | ICD-10-CM | POA: Diagnosis not present

## 2017-09-13 DIAGNOSIS — R450 Nervousness: Secondary | ICD-10-CM

## 2017-09-13 DIAGNOSIS — F331 Major depressive disorder, recurrent, moderate: Secondary | ICD-10-CM

## 2017-09-13 MED ORDER — SERTRALINE HCL 25 MG PO TABS: ORAL_TABLET | ORAL | 0 refills | Status: DC

## 2017-09-13 NOTE — Telephone Encounter (Signed)
09/13/17 11:36am Gave patient referrals for therapist(s) outside the office due to his insurance.Gary KitchenMarguerite Olea

## 2017-09-13 NOTE — Progress Notes (Addendum)
Psychiatric Initial Adult Assessment   Patient Identification: Gary Bean MRN:  [id number] Date of Evaluation:  09/13/2017 Referral Source: Primary care physician  Chief Complaint:  I do not know why I am here.  I guess I have depression.  Visit Diagnosis:    ICD-10-CM   1. MDD (major depressive disorder), recurrent episode, moderate (HCC) F33.1 sertraline (ZOLOFT) 25 MG tablet    History of Present Illness: Gary Bean is a 83 year old Caucasian, married part-time employed referred from primary care physician for the management of depression.  Patient came today with his wife who is concerned about patient's condition.  His wife mentioned that he was doing fine until few months ago started to have depression, social isolation, decreased activity in his life, withdrawn, lack of energy and interest.  Patient is very worried about his physical health, finances, future and well-being.  He lost more than 35 pounds in 6 weeks because he has no appetite.  He also mentioned poor sleep, anhedonia, racing thoughts and generalized weakness.  Wife endorsed that he has not taken bath regularly and requires a lot of encouragement to do the ADLs.  Patient admitted decrease attention, concentration and stays to himself most of the time.  Patient was very sick in February and hospitalized due to dehydration and low-grade fever.  His wife is concerned because he lost the interest watching TV, reading books which he lives to enjoy that.  He stays most of the time to himself.  Patient worried about finances because he owe a lot of money to credit union.  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  Patient denies any agitation, anger, mania but admitted some time feeling paranoid about his coworkers.  He believe they are watching him and is scared from them.  Patient is not sure why they are watching him but he admitted feel nervous.  Patient denies any  suicidal thoughts or homicidal thought.  He denies any self abusive behavior.  Patient was seen in the psychiatric emergency room when his primary care physician recommended inpatient but due to lack of criteria he was not admitted and recommended to see his outpatient psychiatry.  His primary care physician is started him on Zoloft and when the dose increased to 50 mg he could not tolerate and slept 2 days and dose was reduced back to 25 mg.  Patient has no tremors, shakes or any EPS.  Denies drinking alcohol or using any illegal substances.  He lives with his wife who has been married for more than 25 years.  His wife is very supportive.  Patient also prescribed Xanax a few weeks ago which he takes 0.5 mg every other day.  Patient denies any major panic attack, OCD or any PTSD symptoms.  His energy and appetite is low.  Patient currently not seeing any therapist.  He like to resume his part-time work in the future.    Associated Signs/Symptoms: Depression Symptoms:  depressed mood, anhedonia, insomnia, psychomotor retardation, fatigue, feelings of worthlessness/guilt, difficulty concentrating, hopelessness, anxiety, loss of energy/fatigue, disturbed sleep, weight loss, decreased appetite, (Hypo) Manic Symptoms:  Irritable Mood, Anxiety Symptoms:  Excessive Worry, Psychotic Symptoms:  Paranoia, PTSD Symptoms: Negative  Past Psychiatric History:  Patient was admitted to [facility] in 1995 due to severe depression and prescribed Zoloft with good response.  Patient denies any history of suicidal attempt, paranoia or psychosis.  Patient seen Dr. Milagros Loll  in the past.  Previous Psychotropic Medications: Yes   Substance Abuse History in the last 12 months:  No.  Consequences of Substance Abuse: Negative  Past Medical History:  Past Medical History:  Diagnosis Date  . Anxiety   . Atrial fibrillation ([place])   . Depression   . Dizzy 07/27/2017  . Hypertension   .  Nephrolithiasis     Past Surgical History:  Procedure Laterality Date  . CARDIOVERSION N/A 11/30/2014   Procedure: CARDIOVERSION;  Surgeon: Pixie Casino, MD;  Location: [facility] ENDOSCOPY;  Service: Cardiovascular;  Laterality: N/A;  09:19 elective cardioversion with propofol anesthesia,  @ 150 joules from Afib to SR with occassional PVC  . CHOLECYSTECTOMY    . KIDNEY STONE SURGERY      Family Psychiatric History: Sister has depression.  Family History:  Family History  Problem Relation Age of Onset  . CAD Father 45       died  . Goiter Mother   . Prostate cancer Brother   . Rectal cancer Sister 20    Social History:   Social History   Socioeconomic History  . Marital status: Married    Spouse name: Not on file  . Number of children: 1  . Years of education: Not on file  . Highest education level: Not on file  Occupational History  . Not on file  Social Needs  . Financial resource strain: Not on file  . Food insecurity:    Worry: Not on file    Inability: Not on file  . Transportation needs:    Medical: Not on file    Non-medical: Not on file  Tobacco Use  . Smoking status: Former Smoker    Packs/day: 0.50    Years: 20.00    Pack years: 10.00    Types: Cigarettes  . Smokeless tobacco: Never Used  . Tobacco comment: Quit 2003  Substance and Sexual Activity  . Alcohol use: Yes    Alcohol/week: 0.0 oz    Comment: SOCIAL  . Drug use: No  . Sexual activity: Not on file  Lifestyle  . Physical activity:    Days per week: Not on file    Minutes per session: Not on file  . Stress: Not on file  Relationships  . Social connections:    Talks on phone: Not on file    Gets together: Not on file    Attends religious service: Not on file    Active member of club or organization: Not on file    Attends meetings of clubs or organizations: Not on file    Relationship status: Not on file  Other Topics Concern  . Not on file  Social History Narrative   Lives with wife.   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      Additional Social History: Patient born in [place].  His parents deceased.  He married twice.  He has a son from his first marriage who lives in [place].  He has been married to his second wife for 25 years who is very supportive and involved in the treatment plan.  Patient has siblings who lives in [place] and [place].  Allergies:  No Known Allergies  Metabolic Disorder Labs: Recent Results (from the past 2160 hour(s))  Basic metabolic panel     Status: Abnormal   Collection Time: 07/26/17  2:50 PM  Result Value Ref Range   Sodium 136 135 - 145 mmol/L   Potassium 3.4 (L)  3.5 - 5.1 mmol/L   Chloride 96 (L) 101 - 111 mmol/L   CO2 27 22 - 32 mmol/L   Glucose, Bld 160 (H) 65 - 99 mg/dL   BUN 29 (H) 6 - 20 mg/dL   Creatinine, Ser 1.54 (H) 0.61 - 1.24 mg/dL   Calcium 9.7 8.9 - 10.3 mg/dL   GFR calc non Af Amer 41 (L) >60 mL/min   GFR calc Af Amer 47 (L) >60 mL/min    Comment: (NOTE) The eGFR has been calculated using the CKD EPI equation. This calculation has not been validated in all clinical situations. eGFR's persistently <60 mL/min signify possible Chronic Kidney Disease.    Anion gap 13 5 - 15    Comment: Performed at [facility] [address], [place], [place] [address]  CBC     Status: Abnormal   Collection Time: 07/26/17  2:50 PM  Result Value Ref Range   WBC 13.3 (H) 4.0 - 10.5 K/uL   RBC 5.52 4.22 - 5.81 MIL/uL   Hemoglobin 17.2 (H) 13.0 - 17.0 g/dL   HCT 49.9 39.0 - 52.0 %   MCV 90.4 78.0 - 100.0 fL   MCH 31.2 26.0 - 34.0 pg   MCHC 34.5 30.0 - 36.0 g/dL   RDW 13.5 11.5 - 15.5 %   Platelets 198 150 - 400 K/uL    Comment: Performed at [facility] [address], [place], [place] [address]  CBG monitoring, ED     Status: Abnormal   Collection Time: 07/26/17  8:49 PM  Result Value Ref Range   Glucose-Capillary 126 (H) 65 - 99 mg/dL  I-stat troponin, ED     Status: None    Collection Time: 07/26/17  9:02 PM  Result Value Ref Range   Troponin i, poc 0.01 0.00 - 0.08 ng/mL   Comment 3            Comment: Due to the release kinetics of cTnI, a negative result within the first hours of the onset of symptoms does not rule out myocardial infarction with certainty. If myocardial infarction is still suspected, repeat the test at appropriate intervals.   Urinalysis, Routine w reflex microscopic     Status: Abnormal   Collection Time: 07/26/17  9:13 PM  Result Value Ref Range   Color, Urine AMBER (A) YELLOW    Comment: BIOCHEMICALS MAY BE AFFECTED BY COLOR   APPearance CLOUDY (A) CLEAR   Specific Gravity, Urine 1.016 1.005 - 1.030   pH 5.0 5.0 - 8.0   Glucose, UA NEGATIVE NEGATIVE mg/dL   Hgb urine dipstick NEGATIVE NEGATIVE   Bilirubin Urine NEGATIVE NEGATIVE   Ketones, ur NEGATIVE NEGATIVE mg/dL   Protein, ur 30 (A) NEGATIVE mg/dL   Nitrite NEGATIVE NEGATIVE   Leukocytes, UA NEGATIVE NEGATIVE   RBC / HPF 0-5 0 - 5 RBC/hpf   WBC, UA 6-30 0 - 5 WBC/hpf   Bacteria, UA RARE (A) NONE SEEN   Squamous Epithelial / LPF 0-5 (A) NONE SEEN   Mucus PRESENT    Hyaline Casts, UA PRESENT    Granular Casts, UA PRESENT     Comment: Performed at [facility], [address], [place], [place] [address]  Basic metabolic panel     Status: Abnormal   Collection Time: 07/27/17  9:00 AM  Result Value Ref Range   Sodium 137 135 - 145 mmol/L   Potassium 3.3 (L) 3.5 - 5.1 mmol/L   Chloride 104  101 - 111 mmol/L   CO2 21 (L) 22 - 32 mmol/L   Glucose, Bld 126 (H) 65 - 99 mg/dL   BUN 35 (H) 6 - 20 mg/dL   Creatinine, Ser 2.04 (H) 0.61 - 1.24 mg/dL   Calcium 8.7 (L) 8.9 - 10.3 mg/dL   GFR calc non Af Amer 29 (L) >60 mL/min   GFR calc Af Amer 34 (L) >60 mL/min    Comment: (NOTE) The eGFR has been calculated using the CKD EPI equation. This calculation has not been validated in all clinical situations. eGFR's persistently <60 mL/min signify possible Chronic  Kidney Disease.    Anion gap 12 5 - 15    Comment: Performed at [facility] [address], [place] [address]  CBC     Status: None   Collection Time: 07/27/17  9:00 AM  Result Value Ref Range   WBC 9.1 4.0 - 10.5 K/uL   RBC 4.97 4.22 - 5.81 MIL/uL   Hemoglobin 15.1 13.0 - 17.0 g/dL   HCT 44.2 39.0 - 52.0 %   MCV 88.9 78.0 - 100.0 fL   MCH 30.4 26.0 - 34.0 pg   MCHC 34.2 30.0 - 36.0 g/dL   RDW 13.4 11.5 - 15.5 %   Platelets 162 150 - 400 K/uL    Comment: Performed at [facility], [place] [address] [place], [place] [address]  Magnesium     Status: None   Collection Time: 07/27/17  9:00 AM  Result Value Ref Range   Magnesium 2.0 1.7 - 2.4 mg/dL    Comment: Performed at [facility], [place] [address], [place], [place] [address]  Basic metabolic panel     Status: Abnormal   Collection Time: 07/28/17  7:13 AM  Result Value Ref Range   Sodium 141 135 - 145 mmol/L   Potassium 2.9 (L) 3.5 - 5.1 mmol/L   Chloride 103 101 - 111 mmol/L   CO2 25 22 - 32 mmol/L   Glucose, Bld 122 (H) 65 - 99 mg/dL   BUN 27 (H) 6 - 20 mg/dL   Creatinine, Ser 1.55 (H) 0.61 - 1.24 mg/dL   Calcium 8.7 (L) 8.9 - 10.3 mg/dL   GFR calc non Af Amer 40 (L) >60 mL/min   GFR calc Af Amer 47 (L) >60 mL/min    Comment: (NOTE) The eGFR has been calculated using the CKD EPI equation. This calculation has not been validated in all clinical situations. eGFR's persistently <60 mL/min signify possible Chronic Kidney Disease.    Anion gap 13 5 - 15    Comment: Performed at [facility] [address], [place], [place] [address]  Basic metabolic panel     Status: Abnormal   Collection Time: 07/28/17  3:31 PM  Result Value Ref Range   Sodium 140 135 - 145 mmol/L   Potassium 3.3 (L) 3.5 - 5.1 mmol/L   Chloride 104 101 - 111 mmol/L   CO2 26 22 - 32 mmol/L   Glucose, Bld 130 (H) 65 - 99 mg/dL   BUN 23 (H) 6 - 20 mg/dL   Creatinine, Ser 1.25 (H) 0.61 - 1.24 mg/dL   Calcium 8.6 (L) 8.9  - 10.3 mg/dL   GFR calc non Af Amer 52 (L) >60 mL/min   GFR calc Af Amer >60 >60 mL/min    Comment: (NOTE) The eGFR has been calculated using the CKD EPI equation. This calculation has not been validated  in all clinical situations. eGFR's persistently <60 mL/min signify possible Chronic Kidney Disease.    Anion gap 10 5 - 15    Comment: Performed at [facility] [address] [place], [place] [address]  Lipase, blood     Status: None   Collection Time: 08/06/17  3:49 PM  Result Value Ref Range   Lipase 34 11 - 51 U/L    Comment: Performed at [facility] [address] [place], [place] [address]  Comprehensive metabolic panel     Status: Abnormal   Collection Time: 08/06/17  3:49 PM  Result Value Ref Range   Sodium 138 135 - 145 mmol/L   Potassium 3.3 (L) 3.5 - 5.1 mmol/L   Chloride 101 101 - 111 mmol/L   CO2 24 22 - 32 mmol/L   Glucose, Bld 127 (H) 65 - 99 mg/dL   BUN 17 6 - 20 mg/dL   Creatinine, Ser 1.06 0.61 - 1.24 mg/dL   Calcium 8.9 8.9 - 10.3 mg/dL   Total Protein 6.7 6.5 - 8.1 g/dL   Albumin 3.7 3.5 - 5.0 g/dL   AST 31 15 - 41 U/L   ALT 42 17 - 63 U/L   Alkaline Phosphatase 71 38 - 126 U/L   Total Bilirubin 1.2 0.3 - 1.2 mg/dL   GFR calc non Af Amer >60 >60 mL/min   GFR calc Af Amer >60 >60 mL/min    Comment: (NOTE) The eGFR has been calculated using the CKD EPI equation. This calculation has not been validated in all clinical situations. eGFR's persistently <60 mL/min signify possible Chronic Kidney Disease.    Anion gap 13 5 - 15    Comment: Performed at [facility] [address], [place] [address]  CBC     Status: None   Collection Time: 08/06/17  3:49 PM  Result Value Ref Range   WBC 7.7 4.0 - 10.5 K/uL   RBC 5.22 4.22 - 5.81 MIL/uL   Hemoglobin 16.1 13.0 - 17.0 g/dL   HCT 47.2 39.0 - 52.0 %   MCV 90.4 78.0 - 100.0 fL   MCH 30.8 26.0 - 34.0 pg   MCHC 34.1 30.0 - 36.0 g/dL   RDW 13.2 11.5 - 15.5 %   Platelets 187 150  - 400 K/uL    Comment: Performed at [facility] [address], [place], [place] [address]  Urinalysis, Routine w reflex microscopic     Status: Abnormal   Collection Time: 08/06/17  3:52 PM  Result Value Ref Range   Color, Urine AMBER (A) YELLOW    Comment: BIOCHEMICALS MAY BE AFFECTED BY COLOR   APPearance CLEAR CLEAR   Specific Gravity, Urine 1.019 1.005 - 1.030   pH 6.0 5.0 - 8.0   Glucose, UA NEGATIVE NEGATIVE mg/dL   Hgb urine dipstick SMALL (A) NEGATIVE   Bilirubin Urine NEGATIVE NEGATIVE   Ketones, ur 5 (A) NEGATIVE mg/dL   Protein, ur 30 (A) NEGATIVE mg/dL   Nitrite NEGATIVE NEGATIVE   Leukocytes, UA NEGATIVE NEGATIVE   RBC / HPF 6-30 0 - 5 RBC/hpf   WBC, UA 0-5 0 - 5 WBC/hpf   Bacteria, UA NONE SEEN NONE SEEN   Squamous Epithelial / LPF 0-5 (A) NONE SEEN   Mucus PRESENT     Comment: Performed at [facility], [place] [address], [place],  [address]  TSH     Status: Abnormal   Collection Time:  08/06/17  5:58 PM  Result Value Ref Range   TSH 0.300 (L) 0.350 - 4.500 uIU/mL    Comment: Performed by a 3rd Generation assay with a functional sensitivity of <=0.01 uIU/mL. Performed at [facility], [place] [address] [place], [place] [address]   T4, free     Status: Abnormal   Collection Time: 08/06/17  5:58 PM  Result Value Ref Range   Free T4 1.13 (H) 0.61 - 1.12 ng/dL    Comment: (NOTE) Biotin ingestion may interfere with free T4 tests. If the results are inconsistent with the TSH level, previous test results, or the clinical presentation, then consider biotin interference. If needed, order repeat testing after stopping biotin. Performed at [facility], [place] [address], [place], [place] [address]   I-stat troponin, ED     Status: None   Collection Time: 08/06/17  6:04 PM  Result Value Ref Range   Troponin i, poc 0.00 0.00 - 0.08 ng/mL   Comment 3            Comment: Due to the release kinetics of cTnI, a negative result within the first  hours of the onset of symptoms does not rule out myocardial infarction with certainty. If myocardial infarction is still suspected, repeat the test at appropriate intervals.    No results found for: HGBA1C, MPG No results found for: PROLACTIN No results found for: CHOL, TRIG, HDL, CHOLHDL, VLDL, LDLCALC   Current Medications: Current Outpatient Medications  Medication Sig Dispense Refill  . acetaminophen (TYLENOL) 500 MG tablet Take 1,000 mg by mouth every 6 (six) hours as needed for mild pain, moderate pain or headache.     . ALPRAZolam (XANAX) 0.5 MG tablet Take 0.5 mg by mouth at bedtime as needed for anxiety (1.2 tablet every other day).    Marland Kitchen diltiazem (CARDIZEM CD) 240 MG 24 hr capsule TAKE 1 CAPSULE (240 MG TOTAL) BY MOUTH DAILY. 90 capsule 2  . ELIQUIS 5 MG TABS tablet TAKE 1 TABLET BY MOUTH TWICE A DAY 180 tablet 1  . MELATONIN PO Take 1 tablet by mouth at bedtime.    . Omega-3 Fatty Acids (FISH OIL) 1000 MG CAPS Take 1,000 mg by mouth daily.     . Probiotic Product (ALIGN PO) Take 1 tablet by mouth daily.     . sertraline (ZOLOFT) 50 MG tablet Take 50 mg by mouth daily.    . Tetrahydrozoline HCl (VISINE OP) Place 1 drop into both eyes daily as needed (dry eyes).     No current facility-administered medications for this visit.     Neurologic: Headache: No Seizure: No Paresthesias:No  Musculoskeletal: Strength & Muscle Tone: decreased Gait & Station: normal Patient leans: N/A  Psychiatric Specialty Exam: Review of Systems  Constitutional: Positive for malaise/fatigue and weight loss.  HENT: Negative.   Respiratory: Negative.   Musculoskeletal: Negative.   Skin: Negative.   Psychiatric/Behavioral: Positive for depression. The patient is nervous/anxious and has insomnia.     Blood pressure 132/80, pulse 66, height 6' (1.829 m), weight 204 lb (92.5 kg).Body mass index is 27.67 kg/m.  General Appearance: Fairly Groomed and Superficially cooperative and shy.  Eye  Contact:  Fair  Speech:  Slow  Volume:  Decreased  Mood:  Anxious, Depressed and Dysphoric  Affect:  Constricted and Depressed  Thought Process:  Goal Directed  Orientation:  Full (Time, Place, and Person)  Thought Content:  Paranoid Ideation, Rumination and Poverty of thought content  Suicidal Thoughts:  No  Homicidal Thoughts:  No  Memory:  Immediate;   Fair Recent;   Fair Remote;   Fair  Judgement:  Good  Insight:  Good  Psychomotor Activity:  Decreased  Concentration:  Concentration: Fair and Attention Span: Fair  Recall:  [employer] of Knowledge:Good  Language: Good  Akathisia:  No  Handed:  Right  AIMS (if indicated):  0  Assets:  Desire for Improvement Housing Resilience Social Support  ADL's:  Intact  Cognition: WNL  Sleep: Fair   Assessment: Major depressive disorder, recurrent.  Anxiety disorder NOS.  Plan: I review his symptoms, history, current medication, collateral information from other providers and recent blood work results.  Patient is experiencing severe depression and anxiety symptoms.  He had a good response with Zoloft in the past but currently he has difficulty tolerating the dose.  I recommended to try Zoloft 37.5 mg for 2 weeks and gradually increase to 50 mg daily.  We discussed medication side effects that in the beginning may cause excessive sedation, tremors, GI side effects but eventually he can tolerate the dose very well.  However if patient do not see any improvement in difficulty tolerating the medication we will try Remeron.  We also discussed CBT and patient agreed to see a therapist in this office.  Encourage to start daily activities and watching TV and reading which he used to enjoy in the past.  Discussed safety concerns at any time having active suicidal thoughts or homicidal thought that he need to call 911 or go to local emergency room.  Discontinue Xanax as patient will get benefit from Zoloft to help his anxiety.  Discussed  benzodiazepine dependence tolerance withdrawal and sedation.  Recommended to call us back if he has any question or any concern.  Follow-up in 3 weeks.  Kathlee Nations, MD 3/25/20198:52 AM

## 2017-09-15 NOTE — Progress Notes (Deleted)
Cardiology Office Note   Date:  09/15/2017   ID:  Gary Bean, DOB Aug 27, 1934, MRN [id number]  PCP:  Blair Heys, MD  Cardiologist:   Rollene Rotunda, MD   No chief complaint on file.     History of Present Illness: Gary Bean is a 83 y.o. male who presents for evaluation of atrial fibrillation.   He is status post cardioversion. He did have some bleeding with Xarelto in his gums. He did better with Eliquis. ***    Since I last saw him he's done well.  The patient denies any new symptoms such as chest discomfort, neck or arm discomfort. There has been no new shortness of breath, PND or orthopnea. There have been no reported palpitations, presyncope or syncope.  He is somewhat active at work but he does not exercise routinely.   Past Medical History:  Diagnosis Date  . Anxiety   . Atrial fibrillation (HCC)   . Depression   . Dizzy 07/27/2017  . Hypertension   . Nephrolithiasis     Past Surgical History:  Procedure Laterality Date  . CARDIOVERSION N/A 11/30/2014   Procedure: CARDIOVERSION;  Surgeon: Chrystie Nose, MD;  Location: [facility] ENDOSCOPY;  Service: Cardiovascular;  Laterality: N/A;  09:19 elective cardioversion with propofol anesthesia,  @ 150 joules from Afib to SR with occassional PVC  . CHOLECYSTECTOMY    . KIDNEY STONE SURGERY       Current Outpatient Medications  Medication Sig Dispense Refill  . acetaminophen (TYLENOL) 500 MG tablet Take 1,000 mg by mouth every 6 (six) hours as needed for mild pain, moderate pain or headache.     . diltiazem (CARDIZEM CD) 240 MG 24 hr capsule TAKE 1 CAPSULE (240 MG TOTAL) BY MOUTH DAILY. 90 capsule 2  . ELIQUIS 5 MG TABS tablet TAKE 1 TABLET BY MOUTH TWICE A DAY 180 tablet 1  . Omega-3 Fatty Acids (FISH OIL) 1000 MG CAPS Take 1,000 mg by mouth daily.     . Probiotic Product (ALIGN PO) Take 1 tablet by mouth daily.     . sertraline (ZOLOFT) 25 MG tablet Take one and 1/2 tab daily for 2 weeks and than 2 tab daily 60  tablet 0  . Tetrahydrozoline HCl (VISINE OP) Place 1 drop into both eyes daily as needed (dry eyes).     No current facility-administered medications for this visit.     Allergies:   Patient has no known allergies.     ROS:  Please see the history of present illness.   Otherwise, review of systems are positive for positive ***. Otherwise as stated in the history of present illness and negative for all other systems..   All other systems are reviewed and negative.    PHYSICAL EXAM: VS:  There were no vitals taken for this visit. , BMI There is no height or weight on file to calculate BMI.  GENERAL:  Well appearing NECK:  No jugular venous distention, waveform within normal limits, carotid upstroke brisk and symmetric, no bruits, no thyromegaly LUNGS:  Clear to auscultation bilaterally CHEST:  Unremarkable HEART:  PMI not displaced or sustained,S1 and S2 within normal limits, no S3,  no clicks, no rubs, *** murmurs ABD:  Flat, positive bowel sounds normal in frequency in pitch, no bruits, no rebound, no guarding, no midline pulsatile mass, no hepatomegaly, no splenomegaly EXT:  2 plus pulses throughout, no edema, no cyanosis no clubbing    GENERAL:  Well appearing NECK:  No jugular venous distention, waveform within normal limits, carotid upstroke brisk and symmetric, no bruits, no thyromegaly LUNGS:  Clear to auscultation bilaterally CHEST:  Unremarkable HEART:  PMI not displaced or sustained,S1 and S2 within normal limits, no S3, no clicks, no rubs, no murmurs ABD:  Flat, positive bowel sounds normal in frequency in pitch, no bruits, no rebound, no guarding, no midline pulsatile mass, no hepatomegaly, no splenomegaly EXT:  2 plus pulses throughout, no edema, no cyanosis no clubbing   EKG:  EKG is *** ordered today. Atrial fibrillation, rate ***, axis within normal limits, intervals within normal limits, low voltage in the limb leads.   Recent Labs: 07/27/2017: Magnesium  2.0 08/06/2017: ALT 42; BUN 17; Creatinine, Ser 1.06; Hemoglobin 16.1; Platelets 187; Potassium 3.3; Sodium 138; TSH 0.300      Wt Readings from Last 3 Encounters:  08/06/17 240 lb (108.9 kg)  07/26/17 237 lb (107.5 kg)  11/19/16 250 lb (113.4 kg)      Other studies Reviewed: Additional studies/ records that were reviewed today include:  *** Review of the above records demonstrates:  ***  ASSESSMENT AND PLAN:  Atrial fib: [name] has a CHA2DS2 - VASc score of 3 with a risk of stroke of 3.2%.  ***  No change in therapy is planned.    He will continue with meds as listed.  Cardiomyopathy:   ***  He has had a mildly reduced ejection fraction.  I will follow this clinically.   HTN:  ***  The blood pressure is at target. No change in medications is indicated. We will continue with therapeutic lifestyle changes (TLC).  Current medicines are reviewed at length with the patient today.  The patient does not have concerns regarding medicines.  The following changes have been made:  ***  Labs/ tests ordered today include:  ***    No orders of the defined types were placed in this encounter.    Disposition:   FU with me in *** months  Signed, Rollene Rotunda, MD  09/15/2017 9:33 PM    [facility]

## 2017-09-16 ENCOUNTER — Ambulatory Visit: Payer: Medicare Other | Admitting: Cardiology

## 2017-10-04 ENCOUNTER — Ambulatory Visit (HOSPITAL_COMMUNITY): Payer: Medicare Other | Admitting: Psychiatry

## 2017-10-05 ENCOUNTER — Ambulatory Visit (HOSPITAL_COMMUNITY): Payer: Medicare Other | Admitting: Psychiatry

## 2017-10-08 ENCOUNTER — Other Ambulatory Visit: Payer: Self-pay

## 2017-10-08 ENCOUNTER — Ambulatory Visit (INDEPENDENT_AMBULATORY_CARE_PROVIDER_SITE_OTHER): Payer: Medicare Other | Admitting: Psychiatry

## 2017-10-08 DIAGNOSIS — F419 Anxiety disorder, unspecified: Secondary | ICD-10-CM

## 2017-10-08 DIAGNOSIS — G4700 Insomnia, unspecified: Secondary | ICD-10-CM

## 2017-10-08 DIAGNOSIS — R450 Nervousness: Secondary | ICD-10-CM

## 2017-10-08 DIAGNOSIS — F331 Major depressive disorder, recurrent, moderate: Secondary | ICD-10-CM

## 2017-10-08 DIAGNOSIS — Z87891 Personal history of nicotine dependence: Secondary | ICD-10-CM

## 2017-10-08 DIAGNOSIS — Z818 Family history of other mental and behavioral disorders: Secondary | ICD-10-CM

## 2017-10-08 MED ORDER — QUETIAPINE FUMARATE 25 MG PO TABS: ORAL_TABLET | ORAL | 0 refills | Status: DC

## 2017-10-08 MED ORDER — SERTRALINE HCL 50 MG PO TABS: 50.0000 mg | ORAL_TABLET | Freq: Every day | ORAL | 0 refills | Status: DC

## 2017-10-08 NOTE — Progress Notes (Signed)
Pts wife complains of Decreased appetite, weight loss and interrupted restless sleep. Recommended Breeze Nutrition drink since he seems to tolerate fruit water and fruit drinks.

## 2017-10-08 NOTE — Progress Notes (Signed)
BH MD/PA/NP OP Progress Note  10/08/2017 11:11 AM Gary Bean  MRN:  [id number]  Chief Complaint: I am taking Zoloft and it is not bothering me.  I still depressed.  HPI: Patient is 83 year old Caucasian married man who was seen 4 weeks ago as initial evaluation.  He came today with his wife.  We started him on Zoloft.  He is now tolerating Zoloft 25 mg twice a day dose.  However he remains very depressed, withdrawn, worried and anxious about everything.  He continued to have paranoia that people talking about him.  His wife is concerned because he is not sleeping and keep losing weight.  He has no interest in daily activities.  Since we increased the Zoloft he has some interest in reading books but he is still does not watch TV and continue to believe that people talking about him.  He afraid that his utility will be cut off in the morning.  Patient appears very withdrawn.  He denies any agitation, anger, mania but reported paranoia and anxiety.  He denies any suicidal thoughts.  His energy level is low.  He admitted some time crying spells.  He has difficulty expressing his symptoms.  Most of the information was obtained through his wife.  He has no tremors, shakes or any EPS.  He is not interested in counseling.  Patient used to work part-time security guard but due to his health he has not been back to work.  Visit Diagnosis:    ICD-10-CM   1. MDD (major depressive disorder), recurrent episode, moderate (HCC) F33.1 sertraline (ZOLOFT) 50 MG tablet    QUEtiapine (SEROQUEL) 25 MG tablet    Past Psychiatric History: Reviewed. Patient was admitted to [facility].  He had a good response with Zoloft.  Past Medical History:  Past Medical History:  Diagnosis Date  . Anxiety   . Atrial fibrillation (HCC)   . Depression   . Dizzy 07/27/2017  . Hypertension   . Nephrolithiasis     Past Surgical History:  Procedure Laterality Date  . CARDIOVERSION N/A 11/30/2014   Procedure:  CARDIOVERSION;  Surgeon: Chrystie Nose, MD;  Location: [facility] ENDOSCOPY;  Service: Cardiovascular;  Laterality: N/A;  09:19 elective cardioversion with propofol anesthesia,  @ 150 joules from Afib to SR with occassional PVC  . CHOLECYSTECTOMY    . KIDNEY STONE SURGERY      Family Psychiatric History: Reviewed  Family History:  Family History  Problem Relation Age of Onset  . CAD Father 44       died  . Goiter Mother   . Prostate cancer Brother   . Rectal cancer Sister 34  . Depression Sister     Social History:  Social History   Socioeconomic History  . Marital status: Married    Spouse name: Not on file  . Number of children: 1  . Years of education: Not on file  . Highest education level: Not on file  Occupational History  . Not on file  Social Needs  . Financial resource strain: Not on file  . Food insecurity:    Worry: Not on file    Inability: Not on file  . Transportation needs:    Medical: Not on file    Non-medical: Not on file  Tobacco Use  . Smoking status: Former Smoker    Packs/day: 0.50    Years: 20.00    Pack years: 10.00    Types: Cigarettes  . Smokeless tobacco: Never  Used  . Tobacco comment: Quit 2003  Substance and Sexual Activity  . Alcohol use: Yes    Alcohol/week: 0.0 oz    Comment: SOCIAL  . Drug use: No  . Sexual activity: Not on file  Lifestyle  . Physical activity:    Days per week: Not on file    Minutes per session: Not on file  . Stress: Not on file  Relationships  . Social connections:    Talks on phone: Not on file    Gets together: Not on file    Attends religious service: Not on file    Active member of club or organization: Not on file    Attends meetings of clubs or organizations: Not on file    Relationship status: Not on file  Other Topics Concern  . Not on file  Social History Narrative   Lives with wife.  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      Allergies: No Known Allergies  Metabolic  Disorder Labs: No results found for: HGBA1C, MPG No results found for: PROLACTIN No results found for: CHOL, TRIG, HDL, CHOLHDL, VLDL, LDLCALC Lab Results  Component Value Date   TSH 0.300 (L) 08/06/2017   TSH 0.884 11/23/2014    Therapeutic Level Labs: No results found for: LITHIUM No results found for: VALPROATE No components found for:  CBMZ  Current Medications: Current Outpatient Medications  Medication Sig Dispense Refill  . acetaminophen (TYLENOL) 500 MG tablet Take 1,000 mg by mouth every 6 (six) hours as needed for mild pain, moderate pain or headache.     . diltiazem (CARDIZEM CD) 240 MG 24 hr capsule TAKE 1 CAPSULE (240 MG TOTAL) BY MOUTH DAILY. 90 capsule 2  . ELIQUIS 5 MG TABS tablet TAKE 1 TABLET BY MOUTH TWICE A DAY 180 tablet 1  . Probiotic Product (ALIGN PO) Take 1 tablet by mouth daily.     . sertraline (ZOLOFT) 25 MG tablet Take one and 1/2 tab daily for 2 weeks and than 2 tab daily 60 tablet 0  . Tetrahydrozoline HCl (VISINE OP) Place 1 drop into both eyes daily as needed (dry eyes).    . Omega-3 Fatty Acids (FISH OIL) 1000 MG CAPS Take 1,000 mg by mouth daily.      No current facility-administered medications for this visit.      Musculoskeletal: Strength & Muscle Tone: decreased Gait & Station: unsteady Patient leans: N/A  Psychiatric Specialty Exam: Review of Systems  Constitutional: Positive for malaise/fatigue and weight loss.  HENT: Negative.   Skin: Negative.   Psychiatric/Behavioral: Positive for depression. The patient is nervous/anxious and has insomnia.     Blood pressure 107/68, pulse 61, temperature (!) 97.5 F (36.4 C), temperature source Oral, resp. rate 15, weight 192 lb 12.8 oz (87.5 kg), SpO2 96 %.Body mass index is 26.15 kg/m.  General Appearance: Fairly Groomed  Eye Contact:  Fair  Speech:  Slow  Volume:  Decreased  Mood:  Depressed and Dysphoric  Affect:  Constricted and Depressed  Thought Process:  Goal Directed   Orientation:  Full (Time, Place, and Person)  Thought Content: Paranoid Ideation, Rumination and Poverty of thought content   Suicidal Thoughts:  No  Homicidal Thoughts:  No  Memory:  Immediate;   Fair Recent;   Fair Remote;   Fair  Judgement:  Fair  Insight:  Fair  Psychomotor Activity:  Decreased and Psychomotor Retardation  Concentration:  Concentration: Fair and Attention Span: Fair  Recall:  Fair  [employer] of Knowledge: Fair  Language: Fair  Akathisia:  No  Handed:  Right  AIMS (if indicated): not done  Assets:  Desire for Improvement Housing  ADL's:  Intact  Cognition: WNL  Sleep:  Fair   Screenings:   Assessment and Plan: Major depressive disorder, recurrent.  Anxiety disorder NOS.  He is tolerating Zoloft 50 mg without any side effects.  Patient is still have a lot of negative thoughts, paranoia, poor sleep, and he continued to lose weight.  I recommended to try Seroquel up to 50 mg at bedtime to help above symptoms.  One more time encouraged to see a therapist but patient declined.  Encouraged to start daily activities watching TV, reading books.  Discussed safety concerns at any time having active suicidal thoughts or homicidal thought that he need to call 911 or go to local emergency room.  We discussed medication side effects that Seroquel may cause metabolic syndrome, sedation and he need to watch for postural hypotension.  Follow-up in 4 weeks.Time spent 25 minutes.  More than 50% of the time spent in psychoeducation, counseling and coordination of care.  Discuss safety plan that anytime having active suicidal thoughts or homicidal thoughts then patient need to call 911 or go to the local emergency room.     Cleotis Nipper, MD 10/08/2017, 11:11 AM

## 2017-10-10 ENCOUNTER — Other Ambulatory Visit (HOSPITAL_COMMUNITY): Payer: Self-pay | Admitting: Psychiatry

## 2017-10-10 DIAGNOSIS — F331 Major depressive disorder, recurrent, moderate: Secondary | ICD-10-CM

## 2017-10-10 NOTE — Progress Notes (Deleted)
Cardiology Office Note   Date:  10/10/2017   ID:  Gary Bean, DOB [birth year], MRN [id number]  PCP:  Gary Heys, MD  Cardiologist:   Rollene Rotunda, MD   No chief complaint on file.     History of Present Illness: Gary Bean is a 83 y.o. male who presents for evaluation of atrial fibrillation.   He is status post cardioversion. He did have some bleeding with Xarelto in his gums. He did better with Eliquis. ***    Since I last saw him he's done well.  The patient denies any new symptoms such as chest discomfort, neck or arm discomfort. There has been no new shortness of breath, PND or orthopnea. There have been no reported palpitations, presyncope or syncope.  He is somewhat active at work but he does not exercise routinely.   Past Medical History:  Diagnosis Date  . Anxiety   . Atrial fibrillation (HCC)   . Depression   . Dizzy 07/27/2017  . Hypertension   . Nephrolithiasis     Past Surgical History:  Procedure Laterality Date  . CARDIOVERSION N/A 11/30/2014   Procedure: CARDIOVERSION;  Surgeon: Chrystie Nose, MD;  Location: [facility] ENDOSCOPY;  Service: Cardiovascular;  Laterality: N/A;  09:19 elective cardioversion with propofol anesthesia,  @ 150 joules from Afib to SR with occassional PVC  . CHOLECYSTECTOMY    . KIDNEY STONE SURGERY       Current Outpatient Medications  Medication Sig Dispense Refill  . acetaminophen (TYLENOL) 500 MG tablet Take 1,000 mg by mouth every 6 (six) hours as needed for mild pain, moderate pain or headache.     . diltiazem (CARDIZEM CD) 240 MG 24 hr capsule TAKE 1 CAPSULE (240 MG TOTAL) BY MOUTH DAILY. 90 capsule 2  . ELIQUIS 5 MG TABS tablet TAKE 1 TABLET BY MOUTH TWICE A DAY 180 tablet 1  . Omega-3 Fatty Acids (FISH OIL) 1000 MG CAPS Take 1,000 mg by mouth daily.     . Probiotic Product (ALIGN PO) Take 1 tablet by mouth daily.     . QUEtiapine (SEROQUEL) 25 MG tablet Take 1-2 tab at bed time 60 tablet 0  . sertraline (ZOLOFT)  50 MG tablet Take 1 tablet (50 mg total) by mouth daily. 30 tablet 0  . Tetrahydrozoline HCl (VISINE OP) Place 1 drop into both eyes daily as needed (dry eyes).     No current facility-administered medications for this visit.     Allergies:   Patient has no known allergies.     ROS:  Please see the history of present illness.   Otherwise, review of systems are positive for positive ***. Otherwise as stated in the history of present illness and negative for all other systems..   All other systems are reviewed and negative.    PHYSICAL EXAM: VS:  There were no vitals taken for this visit. , BMI There is no height or weight on file to calculate BMI.  GENERAL:  Well appearing NECK:  No jugular venous distention, waveform within normal limits, carotid upstroke brisk and symmetric, no bruits, no thyromegaly LUNGS:  Clear to auscultation bilaterally CHEST:  Unremarkable HEART:  PMI not displaced or sustained,S1 and S2 within normal limits, no S3,  no clicks, no rubs, *** murmurs ABD:  Flat, positive bowel sounds normal in frequency in pitch, no bruits, no rebound, no guarding, no midline pulsatile mass, no hepatomegaly, no splenomegaly EXT:  2 plus pulses throughout, no edema, no cyanosis  no clubbing    GENERAL:  Well appearing NECK:  No jugular venous distention, waveform within normal limits, carotid upstroke brisk and symmetric, no bruits, no thyromegaly LUNGS:  Clear to auscultation bilaterally CHEST:  Unremarkable HEART:  PMI not displaced or sustained,S1 and S2 within normal limits, no S3, no clicks, no rubs, no murmurs ABD:  Flat, positive bowel sounds normal in frequency in pitch, no bruits, no rebound, no guarding, no midline pulsatile mass, no hepatomegaly, no splenomegaly EXT:  2 plus pulses throughout, no edema, no cyanosis no clubbing   EKG:  EKG is *** ordered today. Atrial fibrillation, rate ***, axis within normal limits, intervals within normal limits, low voltage in the  limb leads.   Recent Labs: 07/27/2017: Magnesium 2.0 08/06/2017: ALT 42; BUN 17; Creatinine, Ser 1.06; Hemoglobin 16.1; Platelets 187; Potassium 3.3; Sodium 138; TSH 0.300      Wt Readings from Last 3 Encounters:  08/06/17 240 lb (108.9 kg)  07/26/17 237 lb (107.5 kg)  11/19/16 250 lb (113.4 kg)      Other studies Reviewed: Additional studies/ records that were reviewed today include:  *** Review of the above records demonstrates:  ***  ASSESSMENT AND PLAN:  Atrial fib: [name] has a CHA2DS2 - VASc score of 3 with a risk of stroke of 3.2%.  ***  No change in therapy is planned.    He will continue with meds as listed.  Cardiomyopathy:   ***  He has had a mildly reduced ejection fraction.  I will follow this clinically.   HTN:  ***  The blood pressure is at target. No change in medications is indicated. We will continue with therapeutic lifestyle changes (TLC).  Current medicines are reviewed at length with the patient today.  The patient does not have concerns regarding medicines.  The following changes have been made:  ***  Labs/ tests ordered today include:  ***    No orders of the defined types were placed in this encounter.    Disposition:   FU with me in *** months  Signed, Rollene Rotunda, MD  10/10/2017 2:05 PM     [facility]

## 2017-10-11 ENCOUNTER — Ambulatory Visit: Payer: Medicare Other | Admitting: Cardiology

## 2017-10-14 ENCOUNTER — Emergency Department (HOSPITAL_COMMUNITY)
Admission: EM | Admit: 2017-10-14 | Discharge: 2017-10-15 | Disposition: A | Discharge status: Home / Self Care | Payer: Medicare Other | Attending: Emergency Medicine | Admitting: Emergency Medicine

## 2017-10-14 ENCOUNTER — Emergency Department (HOSPITAL_COMMUNITY): Payer: Medicare Other

## 2017-10-14 ENCOUNTER — Encounter (HOSPITAL_COMMUNITY): Payer: Self-pay

## 2017-10-14 ENCOUNTER — Other Ambulatory Visit: Payer: Self-pay

## 2017-10-14 DIAGNOSIS — Z7901 Long term (current) use of anticoagulants: Secondary | ICD-10-CM | POA: Insufficient documentation

## 2017-10-14 DIAGNOSIS — Z87891 Personal history of nicotine dependence: Secondary | ICD-10-CM | POA: Insufficient documentation

## 2017-10-14 DIAGNOSIS — Z79899 Other long term (current) drug therapy: Secondary | ICD-10-CM | POA: Diagnosis not present

## 2017-10-14 DIAGNOSIS — I10 Essential (primary) hypertension: Secondary | ICD-10-CM | POA: Diagnosis not present

## 2017-10-14 DIAGNOSIS — R042 Hemoptysis: Secondary | ICD-10-CM | POA: Diagnosis not present

## 2017-10-14 LAB — COMPREHENSIVE METABOLIC PANEL
ALT: 33 U/L (ref 17–63)
AST: 26 U/L (ref 15–41)
Albumin: 3.2 g/dL — ABNORMAL LOW (ref 3.5–5.0)
Alkaline Phosphatase: 79 U/L (ref 38–126)
Anion gap: 10 (ref 5–15)
BUN: 15 mg/dL (ref 6–20)
CHLORIDE: 102 mmol/L (ref 101–111)
CO2: 26 mmol/L (ref 22–32)
Calcium: 9.1 mg/dL (ref 8.9–10.3)
Creatinine, Ser: 0.96 mg/dL (ref 0.61–1.24)
GFR calc Af Amer: >60 mL/min (ref >60)
Glucose, Bld: 151 mg/dL — ABNORMAL HIGH (ref 65–99)
Potassium: 3.3 mmol/L — ABNORMAL LOW (ref 3.5–5.1)
Sodium: 138 mmol/L (ref 135–145)
Total Bilirubin: 1.1 mg/dL (ref 0.3–1.2)
Total Protein: 6.1 g/dL — ABNORMAL LOW (ref 6.5–8.1)

## 2017-10-14 LAB — TYPE AND SCREEN
ABO/RH(D): O NEG
Antibody Screen: NEGATIVE

## 2017-10-14 LAB — CBC
HCT: 42.1 % (ref 39.0–52.0)
Hemoglobin: 14.3 g/dL (ref 13.0–17.0)
MCH: 31.4 pg (ref 26.0–34.0)
MCHC: 34 g/dL (ref 30.0–36.0)
MCV: 92.3 fL (ref 78.0–100.0)
PLATELETS: 235 10*3/uL (ref 150–400)
RBC: 4.56 MIL/uL (ref 4.22–5.81)
RDW: 13.7 % (ref 11.5–15.5)
WBC: 9.8 10*3/uL (ref 4.0–10.5)

## 2017-10-14 LAB — ABO/RH: ABO/RH(D): O NEG

## 2017-10-14 NOTE — ED Notes (Signed)
Rounded on patient to reassess and explain wait time. Patient verbalizes understanding and is going to try and stay. No blood noted, just spitting up phlem.

## 2017-10-14 NOTE — ED Triage Notes (Signed)
Pt arrived from home with wife who reports that pt has been coughing up blood in the morning for the past few days but states that today he had a mouth full of blood. Pt is on Eliquis for AFib.

## 2017-10-15 ENCOUNTER — Emergency Department (HOSPITAL_COMMUNITY): Payer: Medicare Other

## 2017-10-15 MED ORDER — IOPAMIDOL (ISOVUE-370) INJECTION 76%
INTRAVENOUS | Status: AC
  Filled 2017-10-15: qty 100

## 2017-10-15 MED ORDER — IOPAMIDOL (ISOVUE-370) INJECTION 76%
100.0000 mL | Freq: Once | INTRAVENOUS | Status: AC | PRN
  Administered 2017-10-15: 100 mL via INTRAVENOUS

## 2017-10-15 MED ORDER — SODIUM CHLORIDE 0.9 % IV BOLUS
500.0000 mL | Freq: Once | INTRAVENOUS | Status: AC
  Administered 2017-10-15: 500 mL via INTRAVENOUS

## 2017-10-15 NOTE — Discharge Instructions (Addendum)
Hold your Eliquis for 2 days, then restart.  Schedule follow-up with your primary doctor next week.

## 2017-10-15 NOTE — ED Notes (Signed)
ED Provider at bedside. 

## 2017-10-15 NOTE — ED Provider Notes (Signed)
[facility] EMERGENCY DEPARTMENT Provider Note   CSN: [id number] Arrival date & time: 10/14/17  1631     History   Chief Complaint Chief Complaint  Patient presents with  . Hemoptysis    HPI Gary Bean is a 83 y.o. male.  Patient presents to the emergency department for evaluation of hemoptysis.  Patient reports that the last couple of mornings when he woke up he would cough and there was a small amount of blood present.  Today, however, he has had multiple episodes of coughing blood.  Wife reports that the last time his entire mouth was filled with blood, he was brought to the ER.  He does take Eliquis secondary to atrial fibrillation.  He denies any significant amount of coughing, has not had cold or upper respiratory symptoms.  Cough is only occasional but he brings up blood when he does cough.  He has, however, had a significant voice change.  Wife reports that he got sick in February and ever since then he has been weak and has significant voice change.  He denies sore throat.  He has not had chest pain.  He does not feel short of breath.  No dizziness, heart palpitations or syncope.     Past Medical History:  Diagnosis Date  . Anxiety   . Atrial fibrillation (HCC)   . Depression   . Dizzy 07/27/2017  . Hypertension   . Nephrolithiasis     Patient Active Problem List   Diagnosis Date Noted  . Dehydration 07/26/2017  . Postural dizziness with presyncope 07/26/2017  . Hypotension 07/26/2017  . Anxiety and depression 07/26/2017  . Hypokalemia 07/26/2017  . AKI (acute kidney injury) (HCC) 07/26/2017  . Atrial fibrillation, unspecified   . Encounter for cardioversion procedure   . Atrial fibrillation (HCC) 11/16/2014  . Nonischemic cardiomyopathy (HCC) 11/16/2014    Past Surgical History:  Procedure Laterality Date  . CARDIOVERSION N/A 11/30/2014   Procedure: CARDIOVERSION;  Surgeon: Chrystie Nose, MD;  Location: [facility] ENDOSCOPY;  Service:  Cardiovascular;  Laterality: N/A;  09:19 elective cardioversion with propofol anesthesia,  @ 150 joules from Afib to SR with occassional PVC  . CHOLECYSTECTOMY    . KIDNEY STONE SURGERY          Home Medications    Prior to Admission medications   Medication Sig Start Date End Date Taking? Authorizing Provider  acetaminophen (TYLENOL) 500 MG tablet Take 1,000 mg by mouth every 6 (six) hours as needed for mild pain, moderate pain or headache.    Yes [provider]  diltiazem (CARDIZEM CD) 240 MG 24 hr capsule TAKE 1 CAPSULE (240 MG TOTAL) BY MOUTH DAILY. 05/12/17  Yes Rollene Rotunda, MD  ELIQUIS 5 MG TABS tablet TAKE 1 TABLET BY MOUTH TWICE A DAY 09/06/17  Yes Rollene Rotunda, MD  Omega-3 Fatty Acids (FISH OIL) 1000 MG CAPS Take 1,000 mg by mouth daily.    Yes [provider]  Probiotic Product (ALIGN PO) Take 1 tablet by mouth daily.    Yes [provider]  QUEtiapine (SEROQUEL) 25 MG tablet Take 1-2 tab at bed time 10/08/17  Yes Arfeen, Phillips Grout, MD  sertraline (ZOLOFT) 50 MG tablet Take 1 tablet (50 mg total) by mouth daily. 10/08/17  Yes Arfeen, Phillips Grout, MD  Tetrahydrozoline HCl (VISINE OP) Place 1 drop into both eyes daily as needed (dry eyes).   Yes [provider]    Family History Family History  Problem Relation  Age of Onset  . CAD Father 31       died  . Goiter Mother   . Prostate cancer Brother   . Rectal cancer Sister 61  . Depression Sister     Social History Social History   Tobacco Use  . Smoking status: Former Smoker    Packs/day: 0.50    Years: 20.00    Pack years: 10.00    Types: Cigarettes  . Smokeless tobacco: Never Used  . Tobacco comment: Quit 2003  Substance Use Topics  . Alcohol use: Yes    Alcohol/week: 0.0 oz    Comment: SOCIAL  . Drug use: No     Allergies   Patient has no known allergies.   Review of Systems Review of Systems  HENT: Positive for voice change.   Respiratory: Positive for cough.   All  other systems reviewed and are negative.    Physical Exam Updated Vital Signs BP 139/87   Pulse 83   Temp 97.6 F (36.4 C) (Oral)   Resp 15   Ht 6' (1.829 m)   Wt 87.1 kg (192 lb)   SpO2 97%   BMI 26.04 kg/m   Physical Exam  Constitutional: He is oriented to person, place, and time. He appears well-developed and well-nourished. No distress.  HENT:  Head: Normocephalic and atraumatic.  Right Ear: Hearing normal.  Left Ear: Hearing normal.  Nose: Nose normal.  Mouth/Throat: Oropharynx is clear and moist and mucous membranes are normal.  Eyes: Pupils are equal, round, and reactive to light. Conjunctivae and EOM are normal.  Neck: Normal range of motion. Neck supple.  Cardiovascular: Regular rhythm, S1 normal and S2 normal. Exam reveals no gallop and no friction rub.  No murmur heard. Pulmonary/Chest: Effort normal and breath sounds normal. No respiratory distress. He exhibits no tenderness.  Abdominal: Soft. Normal appearance and bowel sounds are normal. There is no hepatosplenomegaly. There is no tenderness. There is no rebound, no guarding, no tenderness at McBurney's point and negative Murphy's sign. No hernia.  Musculoskeletal: Normal range of motion.  Neurological: He is alert and oriented to person, place, and time. He has normal strength. No cranial nerve deficit or sensory deficit. Coordination normal. GCS eye subscore is 4. GCS verbal subscore is 5. GCS motor subscore is 6.  Skin: Skin is warm, dry and intact. No rash noted. No cyanosis.  Psychiatric: He has a normal mood and affect. His speech is normal and behavior is normal. Thought content normal.  Nursing note and vitals reviewed.    ED Treatments / Results  Labs (all labs ordered are listed, but only abnormal results are displayed) Labs Reviewed  COMPREHENSIVE METABOLIC PANEL - Abnormal; Notable for the following components:      Result Value   Potassium 3.3 (*)    Glucose, Bld 151 (*)    Total Protein 6.1  (*)    Albumin 3.2 (*)    All other components within normal limits  CBC  TYPE AND SCREEN  ABO/RH    EKG None  Radiology Dg Chest 2 View  Result Date: 10/14/2017 CLINICAL DATA:  Hemoptysis for 2 days. History of hypertension. Former smoker. EXAM: CHEST - 2 VIEW COMPARISON:  08/13/2017 FINDINGS: Normal heart size and pulmonary vascularity. No focal airspace disease or consolidation in the lungs. No blunting of costophrenic angles. No pneumothorax. Mediastinal contours appear intact. Calcified granulomas in the left upper lung with calcified left AP window nodes. Surgical clips in the right upper quadrant. Degenerative  changes in the spine. IMPRESSION: No active cardiopulmonary disease. Electronically Signed   By: Burman Nieves M.D.   On: 10/14/2017 21:54   Ct Soft Tissue Neck W Contrast  Result Date: 10/15/2017 CLINICAL DATA:  Hemoptysis for a few days. On Eliquis. History of atrial fibrillation. EXAM: CT NECK WITH CONTRAST TECHNIQUE: Multidetector CT imaging of the neck was performed using the standard protocol following the bolus administration of intravenous contrast. CONTRAST:  100 cc Isovue 370 ISOVUE-370 IOPAMIDOL (ISOVUE-370) INJECTION 76% COMPARISON:  None. FINDINGS: PHARYNX AND LARYNX: Normal.  Widely patent airway. SALIVARY GLANDS: Normal. THYROID: Heterogeneous thyroid without dominant nodule. LYMPH NODES: No lymphadenopathy by CT size criteria. VASCULAR: Normal. LIMITED INTRACRANIAL: Normal. VISUALIZED ORBITS: Normal. MASTOIDS AND VISUALIZED PARANASAL SINUSES: Well-aerated. SKELETON: Nonacute. Scattered periapical lucency/abscess. Bridging ventral osteophytes seen with DISH. Degenerative change of the cervical spine resulting in moderate to severe LEFT C2-3, LEFT C3-4, LEFT C4-5 neural foraminal narrowing. UPPER CHEST: Lung apices are clear. No superior mediastinal lymphadenopathy. OTHER: None. IMPRESSION: 1. No acute process in the neck or source for hemoptysis. 2. Moderate to severe  LEFT C2-3 through C4-5 neural foraminal narrowing. Findings of DISH. Electronically Signed   By: Awilda Metro M.D.   On: 10/15/2017 04:13   Ct Angio Chest Pe W Or Wo Contrast  Result Date: 10/15/2017 CLINICAL DATA:  Coughing up blood. EXAM: CT ANGIOGRAPHY CHEST WITH CONTRAST TECHNIQUE: Multidetector CT imaging of the chest was performed using the standard protocol during bolus administration of intravenous contrast. Multiplanar CT image reconstructions and MIPs were obtained to evaluate the vascular anatomy. CONTRAST:  100 mL Isovue 370 COMPARISON:  CT abdomen and pelvis 08/06/2017 FINDINGS: Cardiovascular: Good opacification of the central and segmental pulmonary arteries. No focal filling defects. No evidence of significant pulmonary embolus. Normal caliber thoracic aorta. Scattered aortic calcifications. Normal heart size. No pericardial effusion. Coronary artery calcifications. Mediastinum/Nodes: Calcified mediastinal and hilar lymph nodes consistent with postinflammatory change. No significant lymphadenopathy. Esophagus is decompressed. Visualized thyroid gland is unremarkable. Lungs/Pleura: Motion artifact limits evaluation. Dependent changes in the lung bases. No airspace disease or consolidation. No pleural effusions. No pneumothorax. Airways are patent. Upper Abdomen: Surgical absence of the gallbladder. Calcified granulomas in the spleen. Subcentimeter left adrenal gland nodule with fat attenuation consistent with benign adenoma. Musculoskeletal: Degenerative changes in the spine. No destructive bone lesions. Review of the MIP images confirms the above findings. IMPRESSION: 1. No evidence of significant pulmonary embolus. 2. Aortic atherosclerosis and coronary artery calcifications. 3. Postinflammatory calcified lymph nodes in the mediastinum. 4. No evidence of active pulmonary disease. 5. Benign-appearing left adrenal gland nodule. Aortic Atherosclerosis (ICD10-I70.0). Electronically Signed   By:  Burman Nieves M.D.   On: 10/15/2017 04:23    Procedures Procedures (including critical care time)  Medications Ordered in ED Medications  sodium chloride 0.9 % bolus 500 mL (0 mLs Intravenous Stopped 10/15/17 0330)  iopamidol (ISOVUE-370) 76 % injection 100 mL (100 mLs Intravenous Contrast Given 10/15/17 0318)     Initial Impression / Assessment and Plan / ED Course  I have reviewed the triage vital signs and the nursing notes.  Pertinent labs & imaging results that were available during my care of the patient were reviewed by me and considered in my medical decision making (see chart for details).     Patient presents to the ER for evaluation of hemoptysis.  He has reportedly had a cough in the morning the last couple of days that had blood tinged sputum.  Today, however, he had  several episodes of coughing that brought up more blood.  Patient is currently on Eliquis because of any history of atrial fibrillation.  This is likely the cause of the hemoptysis.  He does not have any abnormal vital signs.  He is breathing comfortably, oxygen saturation is 97%.  His hemoglobin is stable.  He was noted to have a hoarse voice during evaluation.  Wife reports that he had a cold more than a month ago and his voice never really came back.  Patient underwent CT of neck with IV contrast to evaluate for possible airway lesion.  No abnormality noted.  He also had a CT angiography to further evaluate for the hemoptysis.  This was negative as well.  No evidence of PE, no pulmonary hemorrhage or abnormality of the lung parenchyma.  Based on this I feel the patient can safely be discharged.  He will hold his Eliquis for 2 days.  Follow-up with PCP next week.  Final Clinical Impressions(s) / ED Diagnoses   Final diagnoses:  Hemoptysis    ED Discharge Orders    None       Gilda Crease, MD 10/15/17 [phone]

## 2017-10-25 ENCOUNTER — Telehealth: Payer: Self-pay | Admitting: Cardiology

## 2017-10-25 NOTE — Telephone Encounter (Signed)
Returned the call to the patient's wife, per the dpr. She stated that the patient recently went to the ED on 4/26 for hemoptysis. The ED has suggested that the patient stay off the Eliquis for 2 days. The wife stated that the patient got better and had stopped throwing up blood. He restarted the Eliquis on Monday 4/29 and started throwing up blood again by Friday.   The wife has taken the patient off of the Eliquis. The patient has been off of the Eliquis since Friday and the hemoptysis has stopped. The wife realizes the importance of taking an anticoagulant with his history afib. She would like to have the provider's recommendation. She did state that the patient is too weak to come in for an appointment. He has lost 58 pounds since February. Message has been routed.

## 2017-10-25 NOTE — Telephone Encounter (Signed)
New Message:      Pt's wife is calling to let us know she has taken pt off of the Eliquis again.

## 2017-10-25 NOTE — Telephone Encounter (Signed)
He can stay off the Eliquis.  However, he absolutely needs to see his PCP to have work up of this as there needs to be a work up for the underlying cause.

## 2017-10-28 NOTE — Telephone Encounter (Signed)
appt schedule to see Dr Antoine Poche on May 16th @ 11:20

## 2017-10-30 ENCOUNTER — Other Ambulatory Visit (HOSPITAL_COMMUNITY): Payer: Self-pay | Admitting: Psychiatry

## 2017-10-30 DIAGNOSIS — F331 Major depressive disorder, recurrent, moderate: Secondary | ICD-10-CM

## 2017-11-03 NOTE — Progress Notes (Signed)
Cardiology Office Note   Date:  11/04/2017   ID:  Gary Bean, DOB 1934-10-08, MRN [id number]  PCP:  Gary Heys, MD  Cardiologist:   Rollene Rotunda, MD   Chief Complaint  Patient presents with  . Follow-up  . Dizziness  . Shortness of Breath      History of Present Illness: Gary Bean is a 83 y.o. male who presents for evaluation of atrial fibrillation.   He is status post cardioversion. He did have some bleeding with Xarelto in his gums. He did better with Eliquis.  However, he called earlier this month with reported hemoptysis and he was taken off of the Eliquis.  He was admitted in Feb with syncope related to dehydration.  He was in the ED for hemoptysis in April and he had a thorough evaluation with CT of the neck and angiography without clear source of bleeding.  I reviewed these records for this visit.     The patient has had a very complicated course since he was sick in February.  He has had a significant weight loss.  He has had a basically the failure to thrive picture.  I spoke with Gary Heys, MD at length today when the patient arrived in the office.  There is been an extensive work-up of this and no clear etiology.  He has been treated for depression.  Today he presents and he fell.  He has significant pelvic pain.  His heart rate is elevated.  His blood pressure is low.  He is not having any presyncope or syncope and he did not lose consciousness.  He is not having any new shortness of breath, PND or orthopnea.  He has had no chest pressure, neck or arm discomfort.  He has had no cough fevers or chills.  He was taking his Eliquis but when this was stopped the hemoptysis stopped.  It is not clear where this is coming from.  Based on the above there is not an obvious pulmonary source.  It does not seem to be GI.   Past Medical History:  Diagnosis Date  . Anxiety   . Atrial fibrillation (HCC)   . Depression   . Dizzy 07/27/2017  . Hypertension   .  Nephrolithiasis     Past Surgical History:  Procedure Laterality Date  . CARDIOVERSION N/A 11/30/2014   Procedure: CARDIOVERSION;  Surgeon: Chrystie Nose, MD;  Location: [facility] ENDOSCOPY;  Service: Cardiovascular;  Laterality: N/A;  09:19 elective cardioversion with propofol anesthesia,  @ 150 joules from Afib to SR with occassional PVC  . CHOLECYSTECTOMY    . KIDNEY STONE SURGERY       Current Outpatient Medications  Medication Sig Dispense Refill  . acetaminophen (TYLENOL) 500 MG tablet Take 1,000 mg by mouth every 6 (six) hours as needed for mild pain, moderate pain or headache.     . diltiazem (CARDIZEM CD) 240 MG 24 hr capsule TAKE 1 CAPSULE (240 MG TOTAL) BY MOUTH DAILY. 90 capsule 2  . Omega-3 Fatty Acids (FISH OIL) 1000 MG CAPS Take 1,000 mg by mouth daily.     . Probiotic Product (ALIGN PO) Take 1 tablet by mouth daily.     . QUEtiapine (SEROQUEL) 25 MG tablet Take 1-2 tab at bed time 60 tablet 0  . sertraline (ZOLOFT) 50 MG tablet Take 1 tablet (50 mg total) by mouth daily. 30 tablet 0  . Tetrahydrozoline HCl (VISINE OP) Place 1 drop into both eyes daily as  needed (dry eyes).     No current facility-administered medications for this visit.     Allergies:   Patient has no known allergies.     ROS:  Please see the history of present illness.   Otherwise, review of systems are positive for positive anorexia, nausea, vomiting, anxiety, weight loss, weakness. Otherwise as stated in the history of present illness and negative for all other systems..   All other systems are reviewed and negative.    PHYSICAL EXAM: VS:  BP (!) 88/56 (BP Location: Right Arm, Patient Position: Sitting, Cuff Size: Normal)   Pulse (!) 138   Ht 6' (1.829 m)   Wt 186 lb (84.4 kg)   BMI 25.23 kg/m  , BMI Body mass index is 25.23 kg/m.  GEN:  No distress, frail-appearing NECK:  No jugular venous distention at 90 degrees, waveform within normal limits, carotid upstroke brisk and symmetric, no bruits,  no thyromegaly LYMPHATICS:  No cervical adenopathy LUNGS:  Clear to auscultation bilaterally BACK:  No CVA tenderness CHEST:  Unremarkable HEART:  S1 and S2 within normal limits, no S3, no clicks, no rubs, no murmurs, irregular ABD:  Positive bowel sounds normal in frequency in pitch, no bruits, no rebound, no guarding, unable to assess midline mass or bruit with the patient seated. EXT:  2 plus pulses throughout, moderate edema, no cyanosis no clubbing SKIN:  No rashes no nodules NEURO:  Cranial nerves II through XII grossly intact, motor grossly intact throughout PSYCH:  Cognitively intact, oriented to person place and time    EKG:  EKG is ordered today. Atrial fibrillation, rate 138, axis within normal limits, low voltage in the limb leads.  Nonspecific ST-T wave changes with QT prolongation   Recent Labs: 07/27/2017: Magnesium 2.0 08/06/2017: TSH 0.300 10/14/2017: ALT 33; BUN 15; Creatinine, Ser 0.96; Hemoglobin 14.3; Platelets 235; Potassium 3.3; Sodium 138      Wt Readings from Last 3 Encounters:  11/04/17 186 lb (84.4 kg)  10/14/17 192 lb (87.1 kg)  08/06/17 240 lb (108.9 kg)      Other studies Reviewed: Additional studies/ records that were reviewed today include:  Long discussion with Gary Heys, MD via phone.  ED records.  Feb hospitalization records Review of the above records demonstrates:    See elsewhere  ASSESSMENT AND PLAN:  Atrial fib: Gary Bean has a CHA2DS2 - VASc score of 3 with a risk of stroke of 3.2%.  The patient has a rapid rate today but he is in pain.  I suspect dehydration as well.  His blood pressure is low.  Because of his fall this morning his wife wanted to take him for imaging so they are going to proceed to the emergency room which I think is reasonable.  For now he cannot be on anticoagulation until it is clear that there is no high risk bleeding issue to explain his hemoptysis.  After discussing this with Gary Heys, MD, since  there is no obvious pulmonary source on imaging and it does not sound like this is a GI etiology, to send him to ENT prior to considering restarting the DOAC.   Unfortunately at this point cardioversion is not an issue is he is not on anticoagulation.  Rate control will be very difficult because of his hypotension.  See below.  Cardiomyopathy:   He seems to be euvolemic and in fact dehydrated.  His blood pressure would not allow further med adjustment.  HTN: This is certainly no longer  the issue as he is hypotensive.  He might need hydration in the emergency room.  I will defer any lab draws to the ER since they are proceeding there for evaluation of his fall and pelvic pain.  Hydration also might help with rate control.  FAILURE TO THRIVE: Again I had a long discussion with Gary Heys, MD who has done an extensive evaluation.  Unfortunately, no clear diagnosis is forthcoming.     (Greater than 40 minutes reviewing all data with greater than 50% face to face with the patient).    Current medicines are reviewed at length with the patient today.  The patient does not have concerns regarding medicines.  The following changes have been made:  None  Labs/ tests ordered today include:  None    No orders of the defined types were placed in this encounter.    Disposition:   FU with me in 1 month  Signed, Rollene Rotunda, MD  11/04/2017 11:37 AM    [facility]

## 2017-11-04 ENCOUNTER — Other Ambulatory Visit: Payer: Self-pay

## 2017-11-04 ENCOUNTER — Encounter (HOSPITAL_COMMUNITY): Payer: Self-pay | Admitting: *Deleted

## 2017-11-04 ENCOUNTER — Emergency Department (HOSPITAL_COMMUNITY): Payer: Medicare Other

## 2017-11-04 ENCOUNTER — Encounter: Payer: Self-pay | Admitting: Cardiology

## 2017-11-04 ENCOUNTER — Emergency Department (HOSPITAL_COMMUNITY)
Admission: EM | Admit: 2017-11-04 | Discharge: 2017-11-04 | Disposition: A | Discharge status: Home / Self Care | Payer: Medicare Other | Attending: Emergency Medicine | Admitting: Emergency Medicine

## 2017-11-04 ENCOUNTER — Other Ambulatory Visit (HOSPITAL_COMMUNITY): Payer: Self-pay | Admitting: Psychiatry

## 2017-11-04 ENCOUNTER — Ambulatory Visit: Payer: Medicare Other | Admitting: Cardiology

## 2017-11-04 VITALS — BP 88/56 | HR 138 | Ht 72.0 in | Wt 186.0 lb

## 2017-11-04 DIAGNOSIS — E860 Dehydration: Secondary | ICD-10-CM | POA: Diagnosis not present

## 2017-11-04 DIAGNOSIS — S3992XA Unspecified injury of lower back, initial encounter: Secondary | ICD-10-CM | POA: Diagnosis present

## 2017-11-04 DIAGNOSIS — W19XXXA Unspecified fall, initial encounter: Secondary | ICD-10-CM

## 2017-11-04 DIAGNOSIS — Z7901 Long term (current) use of anticoagulants: Secondary | ICD-10-CM | POA: Insufficient documentation

## 2017-11-04 DIAGNOSIS — I10 Essential (primary) hypertension: Secondary | ICD-10-CM | POA: Insufficient documentation

## 2017-11-04 DIAGNOSIS — R627 Adult failure to thrive: Secondary | ICD-10-CM | POA: Diagnosis not present

## 2017-11-04 DIAGNOSIS — Z79899 Other long term (current) drug therapy: Secondary | ICD-10-CM | POA: Diagnosis not present

## 2017-11-04 DIAGNOSIS — Y999 Unspecified external cause status: Secondary | ICD-10-CM | POA: Insufficient documentation

## 2017-11-04 DIAGNOSIS — W010XXA Fall on same level from slipping, tripping and stumbling without subsequent striking against object, initial encounter: Secondary | ICD-10-CM | POA: Diagnosis not present

## 2017-11-04 DIAGNOSIS — R102 Pelvic and perineal pain: Secondary | ICD-10-CM | POA: Insufficient documentation

## 2017-11-04 DIAGNOSIS — Y939 Activity, unspecified: Secondary | ICD-10-CM | POA: Diagnosis not present

## 2017-11-04 DIAGNOSIS — I4891 Unspecified atrial fibrillation: Secondary | ICD-10-CM | POA: Insufficient documentation

## 2017-11-04 DIAGNOSIS — I959 Hypotension, unspecified: Secondary | ICD-10-CM | POA: Diagnosis not present

## 2017-11-04 DIAGNOSIS — Y929 Unspecified place or not applicable: Secondary | ICD-10-CM | POA: Diagnosis not present

## 2017-11-04 DIAGNOSIS — Z87891 Personal history of nicotine dependence: Secondary | ICD-10-CM | POA: Insufficient documentation

## 2017-11-04 DIAGNOSIS — F331 Major depressive disorder, recurrent, moderate: Secondary | ICD-10-CM

## 2017-11-04 LAB — COMPREHENSIVE METABOLIC PANEL
ALK PHOS: 75 U/L (ref 38–126)
ALT: 45 U/L (ref 17–63)
ANION GAP: 12 (ref 5–15)
AST: 36 U/L (ref 15–41)
Albumin: 3.3 g/dL — ABNORMAL LOW (ref 3.5–5.0)
BILIRUBIN TOTAL: 1.4 mg/dL — AB (ref 0.3–1.2)
BUN: 10 mg/dL (ref 6–20)
CALCIUM: 9.4 mg/dL (ref 8.9–10.3)
CO2: 22 mmol/L (ref 22–32)
Chloride: 106 mmol/L (ref 101–111)
Creatinine, Ser: 1.28 mg/dL — ABNORMAL HIGH (ref 0.61–1.24)
GFR calc Af Amer: 59 mL/min — ABNORMAL LOW (ref >60)
GFR, EST NON AFRICAN AMERICAN: 51 mL/min — AB (ref >60)
GLUCOSE: 128 mg/dL — AB (ref 65–99)
POTASSIUM: 3.6 mmol/L (ref 3.5–5.1)
Sodium: 140 mmol/L (ref 135–145)
TOTAL PROTEIN: 5.9 g/dL — AB (ref 6.5–8.1)

## 2017-11-04 LAB — CBC WITH DIFFERENTIAL/PLATELET
Abs Immature Granulocytes: 0.1 10*3/uL (ref 0.0–0.1)
Basophils Absolute: 0 10*3/uL (ref 0.0–0.1)
Basophils Relative: 0 %
EOS ABS: 0 10*3/uL (ref 0.0–0.7)
EOS PCT: 0 %
HEMATOCRIT: 41.3 % (ref 39.0–52.0)
HEMOGLOBIN: 14 g/dL (ref 13.0–17.0)
Immature Granulocytes: 1 %
LYMPHS ABS: 1 10*3/uL (ref 0.7–4.0)
LYMPHS PCT: 10 %
MCH: 31 pg (ref 26.0–34.0)
MCHC: 33.9 g/dL (ref 30.0–36.0)
MCV: 91.6 fL (ref 78.0–100.0)
Monocytes Absolute: 0.6 10*3/uL (ref 0.1–1.0)
Monocytes Relative: 6 %
Neutro Abs: 8.1 10*3/uL — ABNORMAL HIGH (ref 1.7–7.7)
Neutrophils Relative %: 83 %
Platelets: 245 10*3/uL (ref 150–400)
RBC: 4.51 MIL/uL (ref 4.22–5.81)
RDW: 12.8 % (ref 11.5–15.5)
WBC: 9.8 10*3/uL (ref 4.0–10.5)

## 2017-11-04 LAB — URINALYSIS, ROUTINE W REFLEX MICROSCOPIC
BILIRUBIN URINE: NEGATIVE
GLUCOSE, UA: NEGATIVE mg/dL
HGB URINE DIPSTICK: NEGATIVE
Ketones, ur: 20 mg/dL — AB
Leukocytes, UA: NEGATIVE
Nitrite: NEGATIVE
PROTEIN: NEGATIVE mg/dL
SPECIFIC GRAVITY, URINE: 1.025 (ref 1.005–1.030)
pH: 7 (ref 5.0–8.0)

## 2017-11-04 LAB — I-STAT CG4 LACTIC ACID, ED
LACTIC ACID, VENOUS: 2.54 mmol/L — AB (ref 0.5–1.9)
LACTIC ACID, VENOUS: 1.31 mmol/L (ref 0.5–1.9)

## 2017-11-04 LAB — LIPASE, BLOOD: LIPASE: 27 U/L (ref 11–51)

## 2017-11-04 MED ORDER — LACTATED RINGERS IV BOLUS
500.0000 mL | Freq: Once | INTRAVENOUS | Status: AC
  Administered 2017-11-04: 500 mL via INTRAVENOUS

## 2017-11-04 MED ORDER — IOHEXOL 300 MG/ML  SOLN
100.0000 mL | Freq: Once | INTRAMUSCULAR | Status: AC | PRN
  Administered 2017-11-04: 100 mL via INTRAVENOUS

## 2017-11-04 MED ORDER — LACTATED RINGERS IV BOLUS
1000.0000 mL | Freq: Once | INTRAVENOUS | Status: AC
  Administered 2017-11-04: 1000 mL via INTRAVENOUS

## 2017-11-04 NOTE — Discharge Instructions (Signed)
Continue to drink plenty of fluids at home. Follow up with primary care provider this week. Return to ER if you have any ongoing problems with near passing out, passing out, or repeat falls, if you have a fever, or if you start having chest pain, breathing problems, abdominal pain, or vomiting.

## 2017-11-04 NOTE — ED Provider Notes (Signed)
[facility] EMERGENCY DEPARTMENT Provider Note   CSN: [id number] Arrival date & time: 11/04/17  1235     History   Chief Complaint Chief Complaint  Patient presents with  . Hypotension  . Fall    HPI Gary Bean is a 83 y.o. male.  HPI Patient is a 83 year old male with a past medical history of chronic A. fib previously on Eliquis which has been halted in the setting of a GI bleed.  Patient stopped taking Eliquis approximately 13 days ago.  Patient initially went to his PCPs office as he had a near syncopal event causing him to fall backwards onto his buttocks landing on his coccyx.  Visit was for tailbone pain, however patient was found to be hypotensive and was sent to the emergency department for further evaluation.  Triage vitals show patient as hypotensive, however once room patient with systolic of 107.  Patient states that he has had increased fatigue as well as worsening appetite over the past several weeks.  Patient has been drinking some fluids but "not as much as I should."  Wife states that he has a history of being dehydrated and believes that to be the case.  Patient denies any chest pain but endorses some shortness of breath.  Patient denies any fevers chills nausea vomiting.  Patient denies abdominal pain but is tender on palpation most notably in the right upper quadrant.  Past Medical History:  Diagnosis Date  . Anxiety   . Atrial fibrillation (HCC)   . Depression   . Dizzy 07/27/2017  . Hypertension   . Nephrolithiasis     Patient Active Problem List   Diagnosis Date Noted  . Dehydration 07/26/2017  . Postural dizziness with presyncope 07/26/2017  . Hypotension 07/26/2017  . Anxiety and depression 07/26/2017  . Hypokalemia 07/26/2017  . AKI (acute kidney injury) (HCC) 07/26/2017  . Atrial fibrillation, unspecified   . Encounter for cardioversion procedure   . Atrial fibrillation (HCC) 11/16/2014  . Nonischemic cardiomyopathy (HCC)  11/16/2014    Past Surgical History:  Procedure Laterality Date  . CARDIOVERSION N/A 11/30/2014   Procedure: CARDIOVERSION;  Surgeon: Chrystie Nose, MD;  Location: [facility] ENDOSCOPY;  Service: Cardiovascular;  Laterality: N/A;  09:19 elective cardioversion with propofol anesthesia,  @ 150 joules from Afib to SR with occassional PVC  . CHOLECYSTECTOMY    . KIDNEY STONE SURGERY          Home Medications    Prior to Admission medications   Medication Sig Start Date End Date Taking? Authorizing Provider  acetaminophen (TYLENOL) 500 MG tablet Take 1,000 mg by mouth every 6 (six) hours as needed for mild pain, moderate pain or headache.    Yes [provider]  diltiazem (CARDIZEM CD) 240 MG 24 hr capsule TAKE 1 CAPSULE (240 MG TOTAL) BY MOUTH DAILY. 05/12/17  Yes Rollene Rotunda, MD  Probiotic Product (ALIGN PO) Take 1 tablet by mouth daily.    Yes [provider]  QUEtiapine (SEROQUEL) 25 MG tablet Take 1-2 tab at bed time Patient taking differently: Take 25 mg by mouth at bedtime.  10/08/17  Yes Arfeen, Phillips Grout, MD  sertraline (ZOLOFT) 50 MG tablet Take 1 tablet (50 mg total) by mouth daily. 10/08/17  Yes Arfeen, Phillips Grout, MD  Tetrahydrozoline HCl (VISINE OP) Place 1 drop into both eyes daily as needed (dry eyes).   Yes [provider]    Family History Family History  Problem Relation Age of Onset  .  CAD Father 50       died  . Goiter Mother   . Prostate cancer Brother   . Rectal cancer Sister 22  . Depression Sister     Social History Social History   Tobacco Use  . Smoking status: Former Smoker    Packs/day: 0.50    Years: 20.00    Pack years: 10.00    Types: Cigarettes  . Smokeless tobacco: Never Used  . Tobacco comment: Quit 2003  Substance Use Topics  . Alcohol use: Yes    Alcohol/week: 0.0 oz    Comment: SOCIAL  . Drug use: No     Allergies   Patient has no known allergies.   Review of Systems Review of Systems  Constitutional:  Positive for appetite change and fatigue. Negative for chills and fever.  Respiratory: Positive for shortness of breath.   Cardiovascular: Negative for chest pain.  Gastrointestinal: Negative for abdominal pain, nausea and vomiting.  All other systems reviewed and are negative.    Physical Exam Updated Vital Signs BP 119/82   Pulse 85   Temp (!) 97.4 F (36.3 C) (Oral)   Resp 12   SpO2 100%   Physical Exam  Constitutional: He appears well-developed and well-nourished.  HENT:  Head: Normocephalic and atraumatic.  Dry MM  Eyes: Pupils are equal, round, and reactive to light. Conjunctivae and EOM are normal.  Neck: Neck supple.  Cardiovascular: Normal rate.  No murmur heard. afib  Pulmonary/Chest: Effort normal. No respiratory distress. He has rales.  Abdominal: Soft. There is tenderness. There is guarding. There is no rebound.  Musculoskeletal: Normal range of motion. He exhibits no edema.  Neurological: He is alert. No cranial nerve deficit or sensory deficit. He exhibits normal muscle tone. Coordination normal.  Skin: Skin is warm and dry.  Psychiatric: He has a normal mood and affect.  Nursing note and vitals reviewed.    ED Treatments / Results  Labs (all labs ordered are listed, but only abnormal results are displayed) Labs Reviewed  CBC WITH DIFFERENTIAL/PLATELET - Abnormal; Notable for the following components:      Result Value   Neutro Abs 8.1 (*)    All other components within normal limits  COMPREHENSIVE METABOLIC PANEL - Abnormal; Notable for the following components:   Glucose, Bld 128 (*)    Creatinine, Ser 1.28 (*)    Total Protein 5.9 (*)    Albumin 3.3 (*)    Total Bilirubin 1.4 (*)    GFR calc non Af Amer 51 (*)    GFR calc Af Amer 59 (*)    All other components within normal limits  I-STAT CG4 LACTIC ACID, ED - Abnormal; Notable for the following components:   Lactic Acid, Venous 2.54 (*)    All other components within normal limits  LIPASE,  BLOOD  URINALYSIS, ROUTINE W REFLEX MICROSCOPIC  BLOOD GAS, VENOUS  I-STAT CG4 LACTIC ACID, ED    EKG None  Radiology Dg Chest 2 View  Result Date: 11/04/2017 CLINICAL DATA:  Shortness of breath. EXAM: CHEST - 2 VIEW COMPARISON:  Chest radiograph October 14, 2017 and chest CT October 15, 2017 FINDINGS: There is no edema or consolidation. Heart is upper normal in size with pulmonary vascularity within normal limits. There is a small calcified aortopulmonary window lymph node. No adenopathy evident. There is mild degenerative change in the thoracic spine. IMPRESSION: Evidence of prior granulomatous disease.  No edema or consolidation. Electronically Signed   By: Bretta Bang  III M.D.   On: 11/04/2017 15:24    Procedures Procedures (including critical care time)  Medications Ordered in ED Medications  lactated ringers bolus 500 mL (has no administration in time range)  lactated ringers bolus 1,000 mL (1,000 mLs Intravenous New Bag/Given 11/04/17 1407)     Initial Impression / Assessment and Plan / ED Course  I have reviewed the triage vital signs and the nursing notes.  Pertinent labs & imaging results that were available during my care of the patient were reviewed by me and considered in my medical decision making (see chart for details).    Patient given 1 L lactated Ringer's with improvement of blood pressure.  Initial lactic acid is elevated.  She has a mild AKI with a serum creatinine 1.28.  Patient with bibasilar crackles on exam, however chest x-ray shows no overt edema or consolidation.  We will additionally collect CT abdomen pelvis for further evaluation of patient's abdominal pain.  Care patient handed off to oncoming team please see their documentation for further details.   Final Clinical Impressions(s) / ED Diagnoses   Final diagnoses:  None    ED Discharge Orders    None       Caren Griffins, MD 11/04/17 1620    Shaune Pollack, MD 11/04/17 [phone]

## 2017-11-04 NOTE — ED Notes (Signed)
Pt transported to XR.  

## 2017-11-04 NOTE — Patient Instructions (Signed)
Medication Instructions:  Continue current medications  If you need a refill on your cardiac medications before your next appointment, please call your pharmacy.  Labwork: None Ordered   Testing/Procedures: None ordered  Follow-Up: Your physician wants you to follow-up in: 1 Months with APP.      Thank you for choosing [facility]!!

## 2017-11-04 NOTE — ED Notes (Signed)
Pt fell this am on his way to see MD. Hip pain and also was told by MD that he was "dehydrated because BP is low"

## 2017-11-04 NOTE — ED Triage Notes (Signed)
Pt presents with wife from his doctors office. The patient has been having issues with dehydration, he is hypotensive in triage 69/45. Pt feels light headed. He also fell from the standing position and landed on his bottom this morning. He has pain in his pelvis.  Pt was on eliquis, pt was taken off 13 days ago.

## 2017-11-05 NOTE — ED Provider Notes (Signed)
I received this patient in signout from Dr. Penne Lash. He has had ongoing problems w/ appetite/failure to thrive and has been worked up by PCP in office. Today had hypotension and near syncope. Fell and landed on bottom, no head injury. We were awaiting CT results. CT shows no acute injuries. He has enlarged prostate and follows with urologist. His BP has remained normal for several hours after initial fluid bolus. His labs are suggestive of mild dehydration which is not surprising given hx. He feels well and wants to go home. Have emphasized importance of continued hydration and close f/u with PCP for his ongoing work up. Return precautions reviewed.   Gary Bean, Ambrose Finland, MD 11/05/17 1331

## 2017-11-10 ENCOUNTER — Encounter (HOSPITAL_COMMUNITY): Payer: Self-pay | Admitting: Psychiatry

## 2017-11-10 ENCOUNTER — Ambulatory Visit (INDEPENDENT_AMBULATORY_CARE_PROVIDER_SITE_OTHER): Payer: Medicare Other | Admitting: Psychiatry

## 2017-11-10 DIAGNOSIS — F331 Major depressive disorder, recurrent, moderate: Secondary | ICD-10-CM

## 2017-11-10 DIAGNOSIS — Z87891 Personal history of nicotine dependence: Secondary | ICD-10-CM

## 2017-11-10 DIAGNOSIS — Z818 Family history of other mental and behavioral disorders: Secondary | ICD-10-CM

## 2017-11-10 DIAGNOSIS — F411 Generalized anxiety disorder: Secondary | ICD-10-CM

## 2017-11-10 MED ORDER — SERTRALINE HCL 50 MG PO TABS: 75.0000 mg | ORAL_TABLET | Freq: Every day | ORAL | 1 refills | Status: AC

## 2017-11-10 MED ORDER — MIRTAZAPINE 15 MG PO TABS: 15.0000 mg | ORAL_TABLET | Freq: Every day | ORAL | 1 refills | Status: DC

## 2017-11-10 NOTE — Progress Notes (Signed)
[facility] MD/PA/NP OP Progress Note  11/10/2017 2:29 PM Gary Bean  MRN:  [id number]  Chief Complaint: I am doing so-so.  I am sleeping 3 hours.  HPI: Gary Bean came for his follow-up appointment with his wife.  On his last visit we started him on Seroquel but he is only sleeping 3 hours a day.  He also seen twice in the emergency room because of fall and dizziness.  He is taking Zoloft 50 mg it is helping some of his depression as he started to watch television and able to read some books.  However his wife is still concerned because he does not have any interest in other daily activities.  He stay to himself most of the time.  He remains isolated, withdrawn with complaining of fatigue, lack of energy and desire to do things.  Today he is more spontaneous his smile and feel medicine helping.  He denies any crying spells.  However his energy level remains very low.  His wife is very concerned because he does not leave the house and does not walk.  His blood pressure is low.  His last labs shows dehydration with mild increase in creatinine.  Patient has no tremors, shakes or any EPS.  She is not interested in counseling.  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  He admitted worried about his future, finances.  Patient's wife told recently he is not been eating as much.  Patient denies drinking or using any illegal substances.    Visit Diagnosis:    ICD-10-CM   1. MDD (major depressive disorder), recurrent episode, moderate (HCC) F33.1 sertraline (ZOLOFT) 50 MG tablet    mirtazapine (REMERON) 15 MG tablet    Past Psychiatric History: Reviewed. Patient has a psychiatric inpatient treatment at [facility] in [facility].  He had a good response with Zoloft.  Recently we tried Seroquel but he could not tolerate more than 25 mg. Past Medical History:  Past Medical History:  Diagnosis Date  . Anxiety   . Atrial fibrillation ([place])   . Depression   . Dizzy  07/27/2017  . Hypertension   . Nephrolithiasis     Past Surgical History:  Procedure Laterality Date  . CARDIOVERSION N/A 11/30/2014   Procedure: CARDIOVERSION;  Surgeon: Pixie Casino, MD;  Location: [facility] ENDOSCOPY;  Service: Cardiovascular;  Laterality: N/A;  09:19 elective cardioversion with propofol anesthesia,  @ 150 joules from Afib to SR with occassional PVC  . CHOLECYSTECTOMY    . KIDNEY STONE SURGERY      Family Psychiatric History: Viewed  Family History:  Family History  Problem Relation Age of Onset  . CAD Father 51       died  . Goiter Mother   . Prostate cancer Brother   . Rectal cancer Sister 57  . Depression Sister     Social History:  Social History   Socioeconomic History  . Marital status: Married    Spouse name: Not on file  . Number of children: 1  . Years of education: Not on file  . Highest education level: Not on file  Occupational History  . Not on file  Social Needs  . Financial resource strain: Not on file  . Food insecurity:    Worry: Not on file    Inability: Not on file  . Transportation needs:    Medical: Not on file    Non-medical: Not on file  Tobacco  Use  . Smoking status: Former Smoker    Packs/day: 0.50    Years: 20.00    Pack years: 10.00    Types: Cigarettes  . Smokeless tobacco: Never Used  . Tobacco comment: Quit 2003  Substance and Sexual Activity  . Alcohol use: Yes    Alcohol/week: 0.0 oz    Comment: SOCIAL  . Drug use: No  . Sexual activity: Not on file  Lifestyle  . Physical activity:    Days per week: Not on file    Minutes per session: Not on file  . Stress: Not on file  Relationships  . Social connections:    Talks on phone: Not on file    Gets together: Not on file    Attends religious service: Not on file    Active member of club or organization: Not on file    Attends meetings of clubs or organizations: Not on file    Relationship status: Not on file  Other Topics Concern  . Not on file  Social  History Narrative   Lives with wife.  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      Allergies: No Known Allergies  Metabolic Disorder Labs: Recent Results (from the past 2160 hour(s))  Comprehensive metabolic panel     Status: Abnormal   Collection Time: 10/14/17  5:19 PM  Result Value Ref Range   Sodium 138 135 - 145 mmol/L   Potassium 3.3 (L) 3.5 - 5.1 mmol/L   Chloride 102 101 - 111 mmol/L   CO2 26 22 - 32 mmol/L   Glucose, Bld 151 (H) 65 - 99 mg/dL   BUN 15 6 - 20 mg/dL   Creatinine, Ser 0.96 0.61 - 1.24 mg/dL   Calcium 9.1 8.9 - 10.3 mg/dL   Total Protein 6.1 (L) 6.5 - 8.1 g/dL   Albumin 3.2 (L) 3.5 - 5.0 g/dL   AST 26 15 - 41 U/L   ALT 33 17 - 63 U/L   Alkaline Phosphatase 79 38 - 126 U/L   Total Bilirubin 1.1 0.3 - 1.2 mg/dL   GFR calc non Af Amer >60 >60 mL/min   GFR calc Af Amer >60 >60 mL/min    Comment: (NOTE) The eGFR has been calculated using the CKD EPI equation. This calculation has not been validated in all clinical situations. eGFR's persistently <60 mL/min signify possible Chronic Kidney Disease.    Anion gap 10 5 - 15    Comment: Performed at [facility] [address], [place] [address]  CBC     Status: None   Collection Time: 10/14/17  5:19 PM  Result Value Ref Range   WBC 9.8 4.0 - 10.5 K/uL   RBC 4.56 4.22 - 5.81 MIL/uL   Hemoglobin 14.3 13.0 - 17.0 g/dL   HCT 42.1 39.0 - 52.0 %   MCV 92.3 78.0 - 100.0 fL   MCH 31.4 26.0 - 34.0 pg   MCHC 34.0 30.0 - 36.0 g/dL   RDW 13.7 11.5 - 15.5 %   Platelets 235 150 - 400 K/uL    Comment: Performed at [facility] [address], [place], [place] [address]  Type and screen [place]     Status: None   Collection Time: 10/14/17  5:45 PM  Result Value Ref Range   ABO/RH(D) O NEG    Antibody Screen NEG    Sample Expiration      10/17/2017 Performed  at [facility], [place] [address] [place], [place] [address]   ABO/Rh     Status: None    Collection Time: 10/14/17  5:45 PM  Result Value Ref Range   ABO/RH(D)      O NEG Performed at [facility] [address], [place], [place] [address]   CBC with Differential     Status: Abnormal   Collection Time: 11/04/17  1:46 PM  Result Value Ref Range   WBC 9.8 4.0 - 10.5 K/uL   RBC 4.51 4.22 - 5.81 MIL/uL   Hemoglobin 14.0 13.0 - 17.0 g/dL   HCT 41.3 39.0 - 52.0 %   MCV 91.6 78.0 - 100.0 fL   MCH 31.0 26.0 - 34.0 pg   MCHC 33.9 30.0 - 36.0 g/dL   RDW 12.8 11.5 - 15.5 %   Platelets 245 150 - 400 K/uL   Neutrophils Relative % 83 %   Neutro Abs 8.1 (H) 1.7 - 7.7 K/uL   Lymphocytes Relative 10 %   Lymphs Abs 1.0 0.7 - 4.0 K/uL   Monocytes Relative 6 %   Monocytes Absolute 0.6 0.1 - 1.0 K/uL   Eosinophils Relative 0 %   Eosinophils Absolute 0.0 0.0 - 0.7 K/uL   Basophils Relative 0 %   Basophils Absolute 0.0 0.0 - 0.1 K/uL   Immature Granulocytes 1 %   Abs Immature Granulocytes 0.1 0.0 - 0.1 K/uL    Comment: Performed at [facility] [address], [place], [place] [address]  Comprehensive metabolic panel     Status: Abnormal   Collection Time: 11/04/17  1:46 PM  Result Value Ref Range   Sodium 140 135 - 145 mmol/L   Potassium 3.6 3.5 - 5.1 mmol/L   Chloride 106 101 - 111 mmol/L   CO2 22 22 - 32 mmol/L   Glucose, Bld 128 (H) 65 - 99 mg/dL   BUN 10 6 - 20 mg/dL   Creatinine, Ser 1.28 (H) 0.61 - 1.24 mg/dL   Calcium 9.4 8.9 - 10.3 mg/dL   Total Protein 5.9 (L) 6.5 - 8.1 g/dL   Albumin 3.3 (L) 3.5 - 5.0 g/dL   AST 36 15 - 41 U/L   ALT 45 17 - 63 U/L   Alkaline Phosphatase 75 38 - 126 U/L   Total Bilirubin 1.4 (H) 0.3 - 1.2 mg/dL   GFR calc non Af Amer 51 (L) >60 mL/min   GFR calc Af Amer 59 (L) >60 mL/min    Comment: (NOTE) The eGFR has been calculated using the CKD EPI equation. This calculation has not been validated in all clinical situations. eGFR's persistently <60 mL/min signify possible Chronic Kidney Disease.    Anion gap 12 5 - 15     Comment: Performed at [facility] [address] [place], [place] [address]  Lipase, blood     Status: None   Collection Time: 11/04/17  1:46 PM  Result Value Ref Range   Lipase 27 11 - 51 U/L    Comment: Performed at [facility] [address] [place], [place] [address]  I-Stat CG4 Lactic Acid, ED     Status: Abnormal   Collection Time: 11/04/17  2:28 PM  Result Value Ref Range   Lactic Acid, Venous 2.54 (HH) 0.5 - 1.9 mmol/L   Comment NOTIFIED PHYSICIAN   I-Stat CG4 Lactic Acid, ED     Status: None   Collection Time: 11/04/17  4:35 PM  Result Value Ref Range   Lactic Acid, Venous 1.31 0.5 - 1.9 mmol/L  Urinalysis, Routine w reflex microscopic     Status: Abnormal   Collection Time: 11/04/17  5:30 PM  Result Value Ref Range   Color, Urine YELLOW YELLOW   APPearance HAZY (A) CLEAR   Specific Gravity, Urine 1.025 1.005 - 1.030   pH 7.0 5.0 - 8.0   Glucose, UA NEGATIVE NEGATIVE mg/dL   Hgb urine dipstick NEGATIVE NEGATIVE   Bilirubin Urine NEGATIVE NEGATIVE   Ketones, ur 20 (A) NEGATIVE mg/dL   Protein, ur NEGATIVE NEGATIVE mg/dL   Nitrite NEGATIVE NEGATIVE   Leukocytes, UA NEGATIVE NEGATIVE    Comment: Performed at [facility] [address], [place], [place] [address]   No results found for: HGBA1C, MPG No results found for: PROLACTIN No results found for: CHOL, TRIG, HDL, CHOLHDL, VLDL, LDLCALC Lab Results  Component Value Date   TSH 0.300 (L) 08/06/2017   TSH 0.884 11/23/2014    Therapeutic Level Labs: No results found for: LITHIUM No results found for: VALPROATE No components found for:  CBMZ  Current Medications: Current Outpatient Medications  Medication Sig Dispense Refill  . acetaminophen (TYLENOL) 500 MG tablet Take 1,000 mg by mouth every 6 (six) hours as needed for mild pain, moderate pain or headache.     . diltiazem (CARDIZEM CD) 240 MG 24 hr capsule TAKE 1 CAPSULE (240 MG TOTAL) BY MOUTH DAILY. 90 capsule 2  . Probiotic Product  (ALIGN PO) Take 1 tablet by mouth daily.     . QUEtiapine (SEROQUEL) 25 MG tablet Take 1-2 tab at bed time (Patient taking differently: Take 25 mg by mouth at bedtime. ) 60 tablet 0  . sertraline (ZOLOFT) 50 MG tablet Take 1 tablet (50 mg total) by mouth daily. 30 tablet 0  . Tetrahydrozoline HCl (VISINE OP) Place 1 drop into both eyes daily as needed (dry eyes).     No current facility-administered medications for this visit.      Musculoskeletal: Strength & Muscle Tone: decreased Gait & Station: unsteady Patient leans: Right, Left, Front and Backward  Psychiatric Specialty Exam: Review of Systems  Constitutional: Positive for malaise/fatigue and weight loss.       Tired  Psychiatric/Behavioral: Positive for depression.    Blood pressure 95/69, pulse 70, height 6' (1.829 m), weight 185 lb 3.2 oz (84 kg), SpO2 97 %.Body mass index is 25.12 kg/m.  General Appearance: Fairly Groomed  Eye Contact:  Fair  Speech:  Slow  Volume:  Decreased  Mood:  Depressed and Dysphoric  Affect:  Constricted and Depressed  Thought Process:  Goal Directed  Orientation:  Full (Time, Place, and Person)  Thought Content: Paranoid Ideation and Rumination, poverty of thought content  Suicidal Thoughts:  No  Homicidal Thoughts:  No  Memory:  Immediate;   Fair Recent;   Fair Remote;   Fair  Judgement:  Fair  Insight:  Fair  Psychomotor Activity:  Decreased and Psychomotor Retardation  Concentration:  Concentration: Fair and Attention Span: Fair  Recall:  [employer] of Knowledge: Fair  Language: Good  Akathisia:  No  Handed:  Right  AIMS (if indicated): not done  Assets:  Desire for Improvement Housing  ADL's:  Intact  Cognition: WNL  Sleep:  Fair   Screenings:   Assessment and Plan: Major depressive disorder, recurrent.  Generalized anxiety disorder.  I reviewed recent records and blood work results from emergency room.  His creatinine is  marginally elevated.  His glucose is 128, albumin  3.3, total protein 5.9 and total bilirubin 1.4.  His wife concerned because he is not drinking and eating as much.  Encourage hydration.  Patient is not interested in counseling.  I will discontinue Seroquel since he cannot tolerate more than 25 mg.  It may be contributing to dizziness.  We will try low-dose Remeron to help the anxiety, insomnia and increased appetite.  Also recommended to try Zoloft 75 mg since patient tolerating better.  Recommended to call us back if he has any question, concern for feel worsening of the symptoms.  Courage to start daily activities including watching TV and reading books.  Discussed postural hypotension with the medication.  Follow-up in 2 months. Time spent 25 minutes.  More than 50% of the time spent in psychoeducation, counseling and coordination of care.  Discuss safety plan that anytime having active suicidal thoughts or homicidal thoughts then patient need to call 911 or go to the local emergency room.     Kathlee Nations, MD 11/10/2017, 2:29 PM

## 2017-11-17 ENCOUNTER — Telehealth (HOSPITAL_COMMUNITY): Payer: Self-pay

## 2017-11-17 ENCOUNTER — Other Ambulatory Visit (HOSPITAL_COMMUNITY): Payer: Self-pay | Admitting: Psychiatry

## 2017-11-17 NOTE — Telephone Encounter (Signed)
Patients wife is calling, patient has been taking Remeron since last Wednesday. Patients wife report that he is sleeping better but as of Saturday he has become very irritable and agitated. She is wondering if this is from the medication and will it last or go away. Please review and advise, thank you

## 2017-11-17 NOTE — Telephone Encounter (Signed)
If he is sleeping better with Remeron I will give more time to adjust with Remeron.  Usually Remeron does not cause irritability and agitation however if symptoms persist then we may need to stop the Remeron.

## 2017-11-18 NOTE — Telephone Encounter (Signed)
I called patients wife back and I let her know what Dr. Lolly Mustache said, she is in agreement with this plan and will call me next week to report his status.

## 2017-11-29 ENCOUNTER — Other Ambulatory Visit (HOSPITAL_COMMUNITY): Payer: Self-pay | Admitting: Psychiatry

## 2017-11-29 ENCOUNTER — Telehealth (HOSPITAL_COMMUNITY): Payer: Self-pay

## 2017-11-29 NOTE — Telephone Encounter (Signed)
Patient is still having the agitation and irritability since starting Remeron and she said he is not eating well or getting up much. She is concerned about what to dao.

## 2017-11-29 NOTE — Telephone Encounter (Signed)
Discontinue Remeron and tried trazodone 25 mg at bedtime.  Continue Zoloft at present dose.

## 2017-11-30 MED ORDER — TRAZODONE HCL 50 MG PO TABS: 25.0000 mg | ORAL_TABLET | Freq: Every day | ORAL | 1 refills | Status: DC

## 2017-12-02 ENCOUNTER — Other Ambulatory Visit (HOSPITAL_COMMUNITY): Payer: Self-pay | Admitting: Psychiatry

## 2017-12-02 DIAGNOSIS — F331 Major depressive disorder, recurrent, moderate: Secondary | ICD-10-CM

## 2017-12-07 ENCOUNTER — Ambulatory Visit: Payer: Medicare Other | Admitting: Cardiology

## 2017-12-29 ENCOUNTER — Ambulatory Visit: Payer: Medicare Other | Admitting: Cardiology

## 2018-01-03 ENCOUNTER — Telehealth (HOSPITAL_COMMUNITY): Payer: Self-pay

## 2018-01-03 ENCOUNTER — Other Ambulatory Visit (HOSPITAL_COMMUNITY): Payer: Self-pay

## 2018-01-03 NOTE — Telephone Encounter (Signed)
Patients PCP called to report a message he got from patients wife stating that the patient is not doing well. He is till not eating and he is still very agitated. Dr. Lolly Mustache changed patients medication last month, would you mind calling the wife to see if there are more changes that need to be made? Thank you

## 2018-01-05 ENCOUNTER — Ambulatory Visit (HOSPITAL_COMMUNITY): Payer: Medicare Other | Admitting: Psychiatry

## 2018-01-05 NOTE — Telephone Encounter (Signed)
He is welcome to come in for a visit with me - need to schedule for 40 minutes visit

## 2018-01-06 ENCOUNTER — Other Ambulatory Visit (HOSPITAL_COMMUNITY): Payer: Self-pay | Admitting: Psychiatry

## 2018-01-06 DIAGNOSIS — F331 Major depressive disorder, recurrent, moderate: Secondary | ICD-10-CM

## 2018-01-24 ENCOUNTER — Ambulatory Visit (HOSPITAL_COMMUNITY): Payer: Self-pay | Admitting: Psychiatry

## 2018-01-24 ENCOUNTER — Encounter

## 2018-02-02 ENCOUNTER — Other Ambulatory Visit: Payer: Self-pay | Admitting: Cardiology

## 2018-02-02 NOTE — Telephone Encounter (Signed)
Rx request sent to pharmacy.  

## 2018-02-23 ENCOUNTER — Encounter

## 2018-02-23 ENCOUNTER — Ambulatory Visit (HOSPITAL_COMMUNITY): Payer: Medicare Other | Admitting: Psychiatry

## 2018-03-31 ENCOUNTER — Other Ambulatory Visit (HOSPITAL_COMMUNITY): Payer: Self-pay | Admitting: Psychiatry

## 2018-04-22 ENCOUNTER — Other Ambulatory Visit: Payer: Self-pay | Admitting: Cardiology

## 2018-09-07 ENCOUNTER — Other Ambulatory Visit: Payer: Self-pay

## 2018-09-07 ENCOUNTER — Encounter (HOSPITAL_COMMUNITY): Payer: Self-pay

## 2018-09-07 ENCOUNTER — Emergency Department (HOSPITAL_COMMUNITY): Payer: Medicare Other

## 2018-09-07 ENCOUNTER — Inpatient Hospital Stay (HOSPITAL_COMMUNITY)
Admission: EM | Admit: 2018-09-07 | Discharge: 2018-09-09 | DRG: 682 | Disposition: A | Discharge status: Home Health | Payer: Medicare Other | Attending: Internal Medicine | Admitting: Internal Medicine

## 2018-09-07 DIAGNOSIS — S50312A Abrasion of left elbow, initial encounter: Secondary | ICD-10-CM | POA: Diagnosis present

## 2018-09-07 DIAGNOSIS — I10 Essential (primary) hypertension: Secondary | ICD-10-CM | POA: Diagnosis present

## 2018-09-07 DIAGNOSIS — W1830XA Fall on same level, unspecified, initial encounter: Secondary | ICD-10-CM | POA: Diagnosis not present

## 2018-09-07 DIAGNOSIS — N136 Pyonephrosis: Secondary | ICD-10-CM | POA: Diagnosis present

## 2018-09-07 DIAGNOSIS — Y92009 Unspecified place in unspecified non-institutional (private) residence as the place of occurrence of the external cause: Secondary | ICD-10-CM | POA: Diagnosis not present

## 2018-09-07 DIAGNOSIS — Z23 Encounter for immunization: Secondary | ICD-10-CM | POA: Diagnosis present

## 2018-09-07 DIAGNOSIS — N401 Enlarged prostate with lower urinary tract symptoms: Secondary | ICD-10-CM | POA: Diagnosis not present

## 2018-09-07 DIAGNOSIS — Z8249 Family history of ischemic heart disease and other diseases of the circulatory system: Secondary | ICD-10-CM

## 2018-09-07 DIAGNOSIS — E860 Dehydration: Secondary | ICD-10-CM | POA: Diagnosis present

## 2018-09-07 DIAGNOSIS — I480 Paroxysmal atrial fibrillation: Secondary | ICD-10-CM | POA: Diagnosis present

## 2018-09-07 DIAGNOSIS — Z9049 Acquired absence of other specified parts of digestive tract: Secondary | ICD-10-CM

## 2018-09-07 DIAGNOSIS — F329 Major depressive disorder, single episode, unspecified: Secondary | ICD-10-CM | POA: Diagnosis present

## 2018-09-07 DIAGNOSIS — N320 Bladder-neck obstruction: Secondary | ICD-10-CM

## 2018-09-07 DIAGNOSIS — E43 Unspecified severe protein-calorie malnutrition: Secondary | ICD-10-CM | POA: Diagnosis present

## 2018-09-07 DIAGNOSIS — Z87891 Personal history of nicotine dependence: Secondary | ICD-10-CM | POA: Diagnosis not present

## 2018-09-07 DIAGNOSIS — Z681 Body mass index (BMI) 19 or less, adult: Secondary | ICD-10-CM

## 2018-09-07 DIAGNOSIS — R338 Other retention of urine: Secondary | ICD-10-CM

## 2018-09-07 DIAGNOSIS — Z79899 Other long term (current) drug therapy: Secondary | ICD-10-CM | POA: Diagnosis not present

## 2018-09-07 DIAGNOSIS — N400 Enlarged prostate without lower urinary tract symptoms: Secondary | ICD-10-CM | POA: Diagnosis present

## 2018-09-07 DIAGNOSIS — T148XXA Other injury of unspecified body region, initial encounter: Secondary | ICD-10-CM

## 2018-09-07 DIAGNOSIS — E876 Hypokalemia: Secondary | ICD-10-CM | POA: Diagnosis present

## 2018-09-07 DIAGNOSIS — R627 Adult failure to thrive: Secondary | ICD-10-CM | POA: Diagnosis present

## 2018-09-07 DIAGNOSIS — I4891 Unspecified atrial fibrillation: Secondary | ICD-10-CM | POA: Diagnosis present

## 2018-09-07 DIAGNOSIS — N179 Acute kidney failure, unspecified: Secondary | ICD-10-CM | POA: Diagnosis not present

## 2018-09-07 DIAGNOSIS — Z9181 History of falling: Secondary | ICD-10-CM | POA: Diagnosis not present

## 2018-09-07 DIAGNOSIS — Z66 Do not resuscitate: Secondary | ICD-10-CM | POA: Diagnosis present

## 2018-09-07 DIAGNOSIS — F419 Anxiety disorder, unspecified: Secondary | ICD-10-CM | POA: Diagnosis present

## 2018-09-07 DIAGNOSIS — W19XXXA Unspecified fall, initial encounter: Secondary | ICD-10-CM

## 2018-09-07 DIAGNOSIS — Z7901 Long term (current) use of anticoagulants: Secondary | ICD-10-CM

## 2018-09-07 DIAGNOSIS — G9341 Metabolic encephalopathy: Secondary | ICD-10-CM | POA: Diagnosis present

## 2018-09-07 LAB — CBC WITH DIFFERENTIAL/PLATELET
Abs Immature Granulocytes: 0.11 10*3/uL — ABNORMAL HIGH (ref 0.00–0.07)
Basophils Absolute: 0 10*3/uL (ref 0.0–0.1)
Basophils Relative: 0 %
Eosinophils Absolute: 0 10*3/uL (ref 0.0–0.5)
Eosinophils Relative: 0 %
HCT: 32.8 % — ABNORMAL LOW (ref 39.0–52.0)
Hemoglobin: 10.5 g/dL — ABNORMAL LOW (ref 13.0–17.0)
Immature Granulocytes: 1 %
Lymphocytes Relative: 5 %
Lymphs Abs: 0.8 10*3/uL (ref 0.7–4.0)
MCH: 30.3 pg (ref 26.0–34.0)
MCHC: 32 g/dL (ref 30.0–36.0)
MCV: 94.8 fL (ref 80.0–100.0)
MONO ABS: 0.6 10*3/uL (ref 0.1–1.0)
Monocytes Relative: 4 %
Neutro Abs: 13 10*3/uL — ABNORMAL HIGH (ref 1.7–7.7)
Neutrophils Relative %: 90 %
Platelets: 196 10*3/uL (ref 150–400)
RBC: 3.46 MIL/uL — ABNORMAL LOW (ref 4.22–5.81)
RDW: 12.9 % (ref 11.5–15.5)
WBC: 14.5 10*3/uL — ABNORMAL HIGH (ref 4.0–10.5)
nRBC: 0 % (ref 0.0–0.2)

## 2018-09-07 LAB — URINALYSIS, ROUTINE W REFLEX MICROSCOPIC
Bilirubin Urine: NEGATIVE
Glucose, UA: NEGATIVE mg/dL
Ketones, ur: NEGATIVE mg/dL
Nitrite: NEGATIVE
Protein, ur: NEGATIVE mg/dL
Specific Gravity, Urine: 1.01 (ref 1.005–1.030)
pH: 5 (ref 5.0–8.0)

## 2018-09-07 LAB — BASIC METABOLIC PANEL
Anion gap: 11 (ref 5–15)
BUN: 112 mg/dL — ABNORMAL HIGH (ref 8–23)
CO2: 21 mmol/L — ABNORMAL LOW (ref 22–32)
Calcium: 8.5 mg/dL — ABNORMAL LOW (ref 8.9–10.3)
Chloride: 105 mmol/L (ref 98–111)
Creatinine, Ser: 7.79 mg/dL — ABNORMAL HIGH (ref 0.61–1.24)
GFR calc Af Amer: 7 mL/min — ABNORMAL LOW (ref >60)
GFR, EST NON AFRICAN AMERICAN: 6 mL/min — AB (ref >60)
Glucose, Bld: 123 mg/dL — ABNORMAL HIGH (ref 70–99)
POTASSIUM: 4.5 mmol/L (ref 3.5–5.1)
Sodium: 137 mmol/L (ref 135–145)

## 2018-09-07 MED ORDER — SODIUM CHLORIDE 0.9 % IV BOLUS
250.0000 mL | Freq: Once | INTRAVENOUS | Status: AC
  Administered 2018-09-07: 250 mL via INTRAVENOUS

## 2018-09-07 MED ORDER — ACETAMINOPHEN 650 MG RE SUPP: 650.0000 mg | Freq: Four times a day (QID) | RECTAL | Status: DC | PRN

## 2018-09-07 MED ORDER — TAMSULOSIN HCL 0.4 MG PO CAPS
0.4000 mg | ORAL_CAPSULE | Freq: Every day | ORAL | Status: DC
  Administered 2018-09-07 – 2018-09-08 (×2): 0.4 mg via ORAL
  Filled 2018-09-07 (×2): qty 1

## 2018-09-07 MED ORDER — DILTIAZEM HCL ER COATED BEADS 120 MG PO CP24
120.0000 mg | ORAL_CAPSULE | Freq: Every day | ORAL | Status: DC
  Administered 2018-09-08 – 2018-09-09 (×2): 120 mg via ORAL
  Filled 2018-09-07 (×2): qty 1

## 2018-09-07 MED ORDER — ALBUTEROL SULFATE (2.5 MG/3ML) 0.083% IN NEBU: 2.5000 mg | INHALATION_SOLUTION | RESPIRATORY_TRACT | Status: DC | PRN

## 2018-09-07 MED ORDER — SODIUM CHLORIDE 0.9 % IV SOLN
INTRAVENOUS | Status: DC
  Administered 2018-09-07: 22:00:00 via INTRAVENOUS

## 2018-09-07 MED ORDER — ACETAMINOPHEN 325 MG PO TABS: 650.0000 mg | ORAL_TABLET | Freq: Four times a day (QID) | ORAL | Status: DC | PRN

## 2018-09-07 MED ORDER — HEPARIN SODIUM (PORCINE) 5000 UNIT/ML IJ SOLN
5000.0000 [IU] | Freq: Three times a day (TID) | INTRAMUSCULAR | Status: DC
  Administered 2018-09-07 – 2018-09-09 (×6): 5000 [IU] via SUBCUTANEOUS
  Filled 2018-09-07 (×6): qty 1

## 2018-09-07 MED ORDER — INFLUENZA VAC SPLIT HIGH-DOSE 0.5 ML IM SUSY
0.5000 mL | PREFILLED_SYRINGE | INTRAMUSCULAR | Status: AC
  Administered 2018-09-08: 0.5 mL via INTRAMUSCULAR
  Filled 2018-09-07: qty 0.5

## 2018-09-07 NOTE — ED Provider Notes (Signed)
Received patient at signout from [facility].  Refer to provider note for full history and physical examination.  Briefly, patient is an 84 year old male with history of hypertension, A. fib currently anticoagulated on Eliquis, anxiety, depression presenting for evaluation status post mechanical fall earlier today.  Has had significant weight loss in the last year or so, decreased appetite and decreased oral intake.  Noticed an abdominal mass 3 weeks ago.  Pending imaging and UA.  Lab work shows AKI with creatinine and BUN markedly elevated above baseline.  Will likely require admission for further evaluation and management.   Physical Exam  BP 117/80   Pulse (!) 110   Temp 97.8 F (36.6 C) (Oral)   Resp 12   Ht 6\' 1"  (1.854 m)   Wt 64.9 kg   SpO2 98%   BMI 18.87 kg/m   Physical Exam Vitals signs and nursing note reviewed.  Constitutional:      General: He is not in acute distress.    Appearance: He is well-developed.     Comments: Thin, chronically ill in appearance  HENT:     Head: Normocephalic and atraumatic.  Eyes:     General:        Right eye: No discharge.        Left eye: No discharge.     Conjunctiva/sclera: Conjunctivae normal.  Neck:     Vascular: No JVD.     Trachea: No tracheal deviation.  Cardiovascular:     Rate and Rhythm: Normal rate.  Pulmonary:     Effort: Pulmonary effort is normal.  Abdominal:     General: There is no distension.     Comments: Abdominal protuberance noted.  Mild discomfort on palpation.  Skin:    Findings: No erythema.  Neurological:     General: No focal deficit present.     Mental Status: He is alert.  Psychiatric:        Behavior: Behavior normal.       MDM  Imaging shows no acute traumatic injuries, no evidence of skull fracture or spine injury.  Noncontrast CT shows markedly distended bladder and bilateral hydroureteronephrosis likely secondary to outlet obstruction.  Foley catheter placed yielded 2.2 L urine output.  UA  equivocal for UTI, will culture.  Spoke with Dr. Randol Kern with [facility] hospitalist service agrees to assume care of patient and bring him into the hospital for further evaluation and management.  Spoke with Dr. Sherron Monday with urology who recommends foley catheter which has already been in place Per my conversation with patient and wife, patient is FULL CODE.       Bennye Alm 09/07/18 1822    Laurence Spates, MD 09/08/18 [phone]

## 2018-09-07 NOTE — ED Provider Notes (Signed)
[facility] EMERGENCY DEPARTMENT Provider Note   CSN: [id number] Arrival date & time: 09/07/18  1245    History   Chief Complaint Chief Complaint  Patient presents with   Fall    HPI Gary Bean is a 84 y.o. male with a past medical history of hypertension, A. fib currently anticoagulated on Eliquis, anxiety, depression who presents to ED for mechanical fall that occurred prior to arrival.  Majority of history is provided by his wife at the bedside.  States that he got a virus 1 year ago and since then he has been "more depressed, not wanting to eat or drink or do much."  He has had progressive generalized weakness and has become "wobbly" on his feet with walking.  States that he has refused to use a walker or cane for assistance.  For the past 3 weeks she has noticed a growing painful mass in his abdomen.  He was walking today when he tried to turn around and then fell, scraped his left arm on the bed.  She denies loss of consciousness.  He denies any headache, vision changes, numbness in arms or legs, vomiting, changes to bowel movements, changes to urination, fever, URI sx, recent travel.      HPI  Past Medical History:  Diagnosis Date   Anxiety    Atrial fibrillation (HCC)    Depression    Dizzy 07/27/2017   Hypertension    Nephrolithiasis     Patient Active Problem List   Diagnosis Date Noted   Dehydration 07/26/2017   Postural dizziness with presyncope 07/26/2017   Hypotension 07/26/2017   Anxiety and depression 07/26/2017   Hypokalemia 07/26/2017   AKI (acute kidney injury) (HCC) 07/26/2017   Atrial fibrillation, unspecified    Encounter for cardioversion procedure    Atrial fibrillation (HCC) 11/16/2014   Nonischemic cardiomyopathy (HCC) 11/16/2014    Past Surgical History:  Procedure Laterality Date   CARDIOVERSION N/A 11/30/2014   Procedure: CARDIOVERSION;  Surgeon: Chrystie Nose, MD;  Location: [facility] ENDOSCOPY;   Service: Cardiovascular;  Laterality: N/A;  09:19 elective cardioversion with propofol anesthesia,  @ 150 joules from Afib to SR with occassional PVC   CHOLECYSTECTOMY     KIDNEY STONE SURGERY          Home Medications    Prior to Admission medications   Medication Sig Start Date End Date Taking? Authorizing Provider  acetaminophen (TYLENOL) 500 MG tablet Take 1,000 mg by mouth every 6 (six) hours as needed for mild pain, moderate pain or headache.     [provider]  diltiazem (CARDIZEM CD) 240 MG 24 hr capsule TAKE 1 CAPSULE (240 MG TOTAL) BY MOUTH DAILY. 02/02/18   Rollene Rotunda, MD  ELIQUIS 5 MG TABS tablet TAKE 1 TABLET BY MOUTH TWICE A DAY 04/22/18   Rollene Rotunda, MD  Probiotic Product (ALIGN PO) Take 1 tablet by mouth daily.     [provider]  sertraline (ZOLOFT) 50 MG tablet Take 1.5 tablets (75 mg total) by mouth daily. 11/10/17   Arfeen, Phillips Grout, MD  Tetrahydrozoline HCl (VISINE OP) Place 1 drop into both eyes daily as needed (dry eyes).    [provider]  traZODone (DESYREL) 50 MG tablet Take 0.5 tablets (25 mg total) by mouth at bedtime. 11/30/17   Arfeen, Phillips Grout, MD    Family History Family History  Problem Relation Age of Onset   CAD Father 29       died  Goiter Mother    Prostate cancer Brother    Rectal cancer Sister 21   Depression Sister     Social History Social History   Tobacco Use   Smoking status: Former Smoker    Packs/day: 0.50    Years: 20.00    Pack years: 10.00    Types: Cigarettes   Smokeless tobacco: Never Used   Tobacco comment: Quit 2003  Substance Use Topics   Alcohol use: Yes    Alcohol/week: 0.0 standard drinks    Comment: SOCIAL   Drug use: No     Allergies   Patient has no known allergies.   Review of Systems Review of Systems  Unable to perform ROS: Age  Constitutional: Positive for activity change, appetite change and fatigue.  Gastrointestinal: Positive for abdominal pain.    Skin: Positive for wound.     Physical Exam Updated Vital Signs BP 132/81    Pulse 86    Temp 97.8 F (36.6 C) (Oral)    Resp 18    Ht  (1.854 m)    Wt 64.9 kg    SpO2 97%    BMI 18.87 kg/m   Physical Exam Vitals signs and nursing note reviewed.  Constitutional:      General: He is not in acute distress.    Appearance: He is well-developed.     Comments: Turned onto the right side which wife states that his baseline.  States that "he never lays on his back."  HENT:     Head: Normocephalic and atraumatic.     Nose: Nose normal.  Eyes:     General: No scleral icterus.       Right eye: No discharge.        Left eye: No discharge.     Conjunctiva/sclera: Conjunctivae normal.  Neck:     Musculoskeletal: Normal range of motion and neck supple.  Cardiovascular:     Rate and Rhythm: Normal rate and regular rhythm.     Heart sounds: Normal heart sounds. No murmur. No friction rub. No gallop.   Pulmonary:     Effort: Pulmonary effort is normal. No respiratory distress.     Breath sounds: Normal breath sounds.  Abdominal:     General: Bowel sounds are normal. There is no distension.     Palpations: Abdomen is soft.     Tenderness: There is no abdominal tenderness. There is no guarding.     Comments: Large palpable mass noted in lower abdomen.  Musculoskeletal: Normal range of motion.  Skin:    General: Skin is warm and dry.     Findings: No rash.     Comments: Skin tear noted on LUE.  Neurological:     General: No focal deficit present.     Mental Status: He is alert.     Cranial Nerves: No cranial nerve deficit.     Sensory: No sensory deficit.     Motor: No weakness or abnormal muscle tone.     Coordination: Coordination normal.     Comments: Alert, oriented x3. Pupils reactive. No facial asymmetry noted. Cranial nerves appear grossly intact. Sensation intact to light touch on face, BUE and BLE.   Psychiatric:        Mood and Affect: Mood is depressed. Affect is flat.       ED Treatments / Results  Labs (all labs ordered are listed, but only abnormal results are displayed) Labs Reviewed  BASIC METABOLIC PANEL - Abnormal; Notable for the  following components:      Result Value   CO2 21 (*)    Glucose, Bld 123 (*)    BUN 112 (*)    Creatinine, Ser 7.79 (*)    Calcium 8.5 (*)    GFR calc non Af Amer 6 (*)    GFR calc Af Amer 7 (*)    All other components within normal limits  CBC WITH DIFFERENTIAL/PLATELET - Abnormal; Notable for the following components:   WBC 14.5 (*)    RBC 3.46 (*)    Hemoglobin 10.5 (*)    HCT 32.8 (*)    Neutro Abs 13.0 (*)    Abs Immature Granulocytes 0.11 (*)    All other components within normal limits  URINE CULTURE  URINALYSIS, ROUTINE W REFLEX MICROSCOPIC    EKG None  Radiology Dg Chest 2 View  Result Date: 09/07/2018 CLINICAL DATA:  Trauma secondary to a fall. EXAM: CHEST - 2 VIEW COMPARISON:  11/04/2017 FINDINGS: The heart size and pulmonary vascularity are normal. There is a new small right posterior pleural effusion. The lungs are otherwise clear except for small calcified granulomas in the left upper lobe and in both hilar regions. No acute bone abnormality. IMPRESSION: Small nonspecific right pleural effusion. No other acute abnormalities. Electronically Signed   By: Francene Boyers M.D.   On: 09/07/2018 14:16   Dg Elbow Complete Left  Result Date: 09/07/2018 CLINICAL DATA:  Left elbow laceration secondary to a fall. EXAM: LEFT ELBOW - COMPLETE 3+ VIEW COMPARISON:  None. FINDINGS: There is no evidence of fracture, dislocation, or joint effusion. There is no evidence of arthropathy or other focal bone abnormality. Soft tissues are unremarkable. IMPRESSION: Negative. Electronically Signed   By: Francene Boyers M.D.   On: 09/07/2018 14:17    Procedures Procedures (including critical care time)  Medications Ordered in ED Medications - No data to display   Initial Impression / Assessment and Plan / ED  Course  I have reviewed the triage vital signs and the nursing notes.  Pertinent labs & imaging results that were available during my care of the patient were reviewed by me and considered in my medical decision making (see chart for details).        84 year old male with a past medical history of hypertension, A. fib currently anticoagulated on Eliquis, anxiety, depression presents to ED for mechanical fall that occurred prior to arrival.  Majority of history is provided by wife at bedside.  States that he has been more depressed, decreased appetite and activity level as well as being "wobbly" on his feet with walking over the past year after suffering from a virus.  She is unable to elaborate.  He had a mechanical fall today when he was walking too fast as he refuses to use assistance with a walker or cane while ambulating.  He scraped his arm on the bed and hit his neck.  Denies any head injury or loss of consciousness.  He denies any headache, vision changes, numbness in arms or legs.  Wife noted a gradually enlarging and painful mass in his lower abdomen which she refuses to be evaluated by a PCP for.  On my exam mass is noticeable and somewhat tender to palpation.  Skin abrasions noted on left upper extremity without laceration.  Wife states that he is at his mental baseline, he is alert and oriented to self, situation and day of the week.  No C, T or L-spine tenderness to palpation.  Vital signs  are within normal limits.  Plan to obtain imaging of left elbow, chest, CT of the head and cervical spine, CT abdomen pelvis and baseline lab work.  3:18 PM CXR negative, elbow xray is negative. CBC with leukocytosis at 14.5, hgb of 10.5. BMP with elevation in creatinine to 7.79, increased from 10 months ago was 1.28. Will switch to CT A/P without contrast.  Care handed off to oncoming provider [facility] pending imaging and UA. Anticipate admission for AKI at the least.   Final Clinical Impressions(s) /  ED Diagnoses   Final diagnoses:  Fall in home, initial encounter  Abrasion  AKI (acute kidney injury) [facility])    ED Discharge Orders    None       Portions of this note were generated with Dragon dictation software. Dictation errors may occur despite best attempts at proofreading.    Dietrich Pates, PA-C 09/07/18 1541    Sabas Sous, MD 09/08/18 [phone]

## 2018-09-07 NOTE — ED Notes (Signed)
Report given to 5W RN. All questions answered 

## 2018-09-07 NOTE — H&P (Signed)
TRH H&P   Patient Demographics:    Gary Bean, is a 84 y.o. male  MRN: [id number]   DOB - Nov 15, 1934  Admit Date - 09/07/2018  Outpatient Primary MD for the patient is Blair Heys, MD  Referring MD/NP/PA: PA Luevenia Maxin  Patient coming from: Home  Chief Complaint  Patient presents with   Fall      HPI:    Gary Bean  is a 84 y.o. male, with past medical history of hypertension, A. fib, of Eliquis for last year, anxiety, depression, BPH, presents to ED secondary to mechanical fall, patient is poor historian, wife at bedside assist with a history, patient has been progressively declining over last few month, less appetite, poor oral intake, more depressed per his wife, unsteady gait, wobbly, for last 3 weeks he was noted to have some abdominal distention, he had mechanical fall today, scraped his left arm on the bed, no head trauma, no loss of consciousness, no headache, vision change, focal deficit. - IN ED CT head and neck with no acute finding, CT abdomen pelvis was obtained significant for BPH, distended bladder and bilateral hydronephrosis, as well had AKI with a creatinine of 7.79, potassium within normal limit at 4.5, mild leukocytosis at 14.5, chest x-ray with no evidence of infection, UA showing some pyuria, he had Foley catheter inserted with 2 L urine output.    Review of systems:    He is very poor historian, obtain review of system to his best knowledge In addition to the HPI above,  No Fever-chills, has generalized weakness, poor appetite, extremely frail No Headache, No changes with Vision or hearing, No problems swallowing food or Liquids, No Chest pain, Cough or Shortness of Breath, Abdominal pain and distention, no Nausea or Vommitting, Bowel movements are regular, No Blood in stool or Urine, Himself does not report any urinary symptoms, but certainly he has  urinary retention No new skin rashes or bruises, No new joints pains-aches,  No new weakness, tingling, numbness in any extremity, No recent weight gain or loss, No polyuria, polydypsia or polyphagia, No significant Mental Stressors.  A full 10 point Review of Systems was done, except as stated above, all other Review of Systems were negative.   With Past History of the following :    Past Medical History:  Diagnosis Date   Anxiety    Atrial fibrillation (HCC)    Depression    Dizzy 07/27/2017   Hypertension    Nephrolithiasis       Past Surgical History:  Procedure Laterality Date   CARDIOVERSION N/A 11/30/2014   Procedure: CARDIOVERSION;  Surgeon: Chrystie Nose, MD;  Location: [facility] ENDOSCOPY;  Service: Cardiovascular;  Laterality: N/A;  09:19 elective cardioversion with propofol anesthesia,  @ 150 joules from Afib to SR with occassional PVC   CHOLECYSTECTOMY     KIDNEY STONE SURGERY  Social History:     Social History   Tobacco Use   Smoking status: Former Smoker    Packs/day: 0.50    Years: 20.00    Pack years: 10.00    Types: Cigarettes   Smokeless tobacco: Never Used   Tobacco comment: Quit 2003  Substance Use Topics   Alcohol use: Yes    Alcohol/week: 0.0 standard drinks    Comment: SOCIAL     Lives -at home  Mobility -assistance     Family History :     Family History  Problem Relation Age of Onset   CAD Father 87       died   Goiter Mother    Prostate cancer Brother    Rectal cancer Sister 82   Depression Sister      Home Medications:   Prior to Admission medications   Medication Sig Start Date End Date Taking? Authorizing Provider  acetaminophen (TYLENOL) 500 MG tablet Take 500 mg by mouth every 6 (six) hours as needed for mild pain, moderate pain or headache.    Yes [provider]  diltiazem (CARDIZEM CD) 120 MG 24 hr capsule Take 120 mg by mouth daily. 07/30/18  Yes [provider]    Probiotic Product (ALIGN PO) Take 1 tablet by mouth daily.    Yes [provider]  sertraline (ZOLOFT) 50 MG tablet Take 1.5 tablets (75 mg total) by mouth daily. Patient taking differently: Take 75 mg by mouth every evening. 1500 11/10/17  Yes Arfeen, Phillips Grout, MD  Tetrahydrozoline HCl (VISINE OP) Place 1 drop into both eyes daily as needed (dry eyes).   Yes [provider]  TRINTELLIX 20 MG TABS tablet Take 20 mg by mouth at bedtime. 08/20/18  Yes [provider]  diltiazem (CARDIZEM CD) 240 MG 24 hr capsule TAKE 1 CAPSULE (240 MG TOTAL) BY MOUTH DAILY. Patient not taking: Reported on 09/07/2018 02/02/18   Rollene Rotunda, MD  ELIQUIS 5 MG TABS tablet TAKE 1 TABLET BY MOUTH TWICE A DAY Patient not taking: Reported on 09/07/2018 04/22/18   Rollene Rotunda, MD  traZODone (DESYREL) 50 MG tablet Take 0.5 tablets (25 mg total) by mouth at bedtime. Patient not taking: Reported on 09/07/2018 11/30/17   Cleotis Nipper, MD     Allergies:    No Known Allergies   Physical Exam:   Vitals  Blood pressure 124/75, pulse 91, temperature 97.8 F (36.6 C), temperature source Oral, resp. rate 11, height 6\' 1"  (1.854 m), weight 64.9 kg, SpO2 98 %.   1. General Ext frail elderly male, laying in bed in no apparent distress  2.  Somehow impaired affect and insight, no suicidal thoughts and ideations, awake alert, oriented x2.   3. No F.N deficits, ALL C.Nerves Intact, Strength 5/5 all 4 extremities, Sensation intact all 4 extremities, Plantars down going.  4. Ears and Eyes appear Normal, Conjunctivae clear, PERRLA. Dry  Oral Mucosa.  5. Supple Neck, No JVD, No cervical lymphadenopathy appriciated, No Carotid Bruits.  6. Symmetrical Chest wall movement, Good air movement bilaterally, CTAB.  7.  Irregular irregular, No Gallops, Rubs or Murmurs, No Parasternal Heave.  8. Positive Bowel Sounds, Abdomen Soft, No tenderness, No organomegaly appriciated,No rebound -guarding or  rigidity.  9.  No Cyanosis, delayed skin turgor, patient has left elbow bandaged secondary to wound  10.  Difficult muscle wasting,  joints appear normal , no effusions, Normal ROM.  11. No Palpable Lymph Nodes in Neck or Axillae  *  Data Review:    CBC Recent Labs  Lab 09/07/18 1430  WBC 14.5*  HGB 10.5*  HCT 32.8*  PLT 196  MCV 94.8  MCH 30.3  MCHC 32.0  RDW 12.9  LYMPHSABS 0.8  MONOABS 0.6  EOSABS 0.0  BASOSABS 0.0   ------------------------------------------------------------------------------------------------------------------  Chemistries  Recent Labs  Lab 09/07/18 1430  NA 137  K 4.5  CL 105  CO2 21*  GLUCOSE 123*  BUN 112*  CREATININE 7.79*  CALCIUM 8.5*   ------------------------------------------------------------------------------------------------------------------ estimated creatinine clearance is 6.7 mL/min (A) (by C-G formula based on SCr of 7.79 mg/dL (H)). ------------------------------------------------------------------------------------------------------------------ No results for input(s): TSH, T4TOTAL, T3FREE, THYROIDAB in the last 72 hours.  Invalid input(s): FREET3  Coagulation profile No results for input(s): INR, PROTIME in the last 168 hours. ------------------------------------------------------------------------------------------------------------------- No results for input(s): DDIMER in the last 72 hours. -------------------------------------------------------------------------------------------------------------------  Cardiac Enzymes No results for input(s): CKMB, TROPONINI, MYOGLOBIN in the last 168 hours.  Invalid input(s): CK ------------------------------------------------------------------------------------------------------------------ No results found for: BNP   ---------------------------------------------------------------------------------------------------------------  Urinalysis    Component Value  Date/Time   COLORURINE YELLOW 09/07/2018 1711   APPEARANCEUR CLEAR 09/07/2018 1711   LABSPEC 1.010 09/07/2018 1711   PHURINE 5.0 09/07/2018 1711   GLUCOSEU NEGATIVE 09/07/2018 1711   HGBUR LARGE (A) 09/07/2018 1711   BILIRUBINUR NEGATIVE 09/07/2018 1711   KETONESUR NEGATIVE 09/07/2018 1711   PROTEINUR NEGATIVE 09/07/2018 1711   NITRITE NEGATIVE 09/07/2018 1711   LEUKOCYTESUR SMALL (A) 09/07/2018 1711    ----------------------------------------------------------------------------------------------------------------   Imaging Results:    Ct Abdomen Pelvis Wo Contrast  Result Date: 09/07/2018 CLINICAL DATA:  Abdominal distension. EXAM: CT ABDOMEN AND PELVIS WITHOUT CONTRAST TECHNIQUE: Multidetector CT imaging of the abdomen and pelvis was performed following the standard protocol without IV contrast. COMPARISON:  11/04/2017 FINDINGS: Lower chest: Moderate to large right effusion layering dependently with dependent atelectasis on the right. The left lung base is clear except for mild edema. Hepatobiliary: No liver parenchymal abnormality is seen. Previous cholecystectomy. Pancreas: Pancreas appears intrinsically normal. Spleen: Normal Adrenals/Urinary Tract: Adrenal glands are normal. Renal parenchyma shows some chronic cysts and nonobstructing calculi, none larger than 4 mm. There is bilateral hydroureteronephrosis, probably secondary to distended bladder/bladder outlet obstruction. Stomach/Bowel: No primary bowel pathology is seen. Vascular/Lymphatic: Diffuse retroperitoneal and dependent body edema. Aortic atherosclerosis without aneurysm. IVC is normal. No retroperitoneal adenopathy. Reproductive: Prostate gland only appears mildly enlarged. There appears to be fluid dilatation of the prostatic urethra as well as the proximal penile urethra. Possibility of a urethral obstruction does exist. Other: No free fluid or air. Musculoskeletal: Chronic degenerative changes of the lumbar spine. Chronic  pars defects at L5. IMPRESSION: Right pleural effusion layering dependently with dependent atelectasis. Distended bladder. Bilateral hydroureteronephrosis. Findings probably secondary to outlet obstruction. There appears to be dilatation of the prostatic urethra and the proximal penile urethra, and the level of obstruction could be within the penile urethra. Prostate gland does not look that large. Previously, there had been seen to be some stones dependent in the bladder. Today there is only a small focal calcification of the right posterior bladder wall or small stone in that location. I can not definitely see a urethral stone however. Electronically Signed   By: Paulina Fusi M.D.   On: 09/07/2018 16:05   Dg Chest 2 View  Result Date: 09/07/2018 CLINICAL DATA:  Trauma secondary to a fall. EXAM: CHEST - 2 VIEW COMPARISON:  11/04/2017 FINDINGS: The heart size and pulmonary vascularity are normal. There is a new  small right posterior pleural effusion. The lungs are otherwise clear except for small calcified granulomas in the left upper lobe and in both hilar regions. No acute bone abnormality. IMPRESSION: Small nonspecific right pleural effusion. No other acute abnormalities. Electronically Signed   By: Francene Boyers M.D.   On: 09/07/2018 14:16   Dg Elbow Complete Left  Result Date: 09/07/2018 CLINICAL DATA:  Left elbow laceration secondary to a fall. EXAM: LEFT ELBOW - COMPLETE 3+ VIEW COMPARISON:  None. FINDINGS: There is no evidence of fracture, dislocation, or joint effusion. There is no evidence of arthropathy or other focal bone abnormality. Soft tissues are unremarkable. IMPRESSION: Negative. Electronically Signed   By: Francene Boyers M.D.   On: 09/07/2018 14:17   Ct Head Wo Contrast  Result Date: 09/07/2018 CLINICAL DATA:  Fall. EXAM: CT HEAD WITHOUT CONTRAST CT CERVICAL SPINE WITHOUT CONTRAST TECHNIQUE: Multidetector CT imaging of the head and cervical spine was performed following the standard  protocol without intravenous contrast. Multiplanar CT image reconstructions of the cervical spine were also generated. COMPARISON:  Head CT 08/26/2017. Soft tissue neck CT 10/15/2017. FINDINGS: CT HEAD FINDINGS Brain: There is no evidence of acute infarct, intracranial hemorrhage, mass, midline shift, or extra-axial fluid collection. Mild cerebral atrophy is unchanged. Vascular: Calcified atherosclerosis at the skull base. No hyperdense vessel. Skull: No fracture or suspicious osseous lesion. Sinuses/Orbits: Paranasal sinuses and mastoid air cells are clear. Unremarkable orbits. Other: None. CT CERVICAL SPINE FINDINGS Alignment: Unchanged trace degenerative anterolisthesis of C7 on T1. Skull base and vertebrae: No acute fracture. Multilevel degenerative endplate changes with flowing anterior vertebral ossification extending from C3 into the thoracic spine suggesting DISH. Soft tissues and spinal canal: No prevertebral fluid or swelling. No visible canal hematoma. Disc levels: Moderate to severe left neural foraminal stenosis at C2-3, C3-4, and C4-5 due to uncovertebral spurring at each level as well as severe left facet arthrosis at C2-3. Mild spinal stenosis at C5-6 due to right paracentral osteophyte. Upper chest: Partially visualized small right pleural effusion. No apical lung consolidation. Other: Diffusely heterogeneous thyroid, similar to the prior CT and without a clearly dominant nodule. IMPRESSION: 1. No evidence of acute intracranial abnormality. 2. No evidence of acute cervical spine fracture or traumatic subluxation. 3. Findings of DISH in the cervical and upper thoracic spine. 4. Small right pleural effusion. Electronically Signed   By: Sebastian Ache M.D.   On: 09/07/2018 16:13   Ct Cervical Spine Wo Contrast  Result Date: 09/07/2018 CLINICAL DATA:  Fall. EXAM: CT HEAD WITHOUT CONTRAST CT CERVICAL SPINE WITHOUT CONTRAST TECHNIQUE: Multidetector CT imaging of the head and cervical spine was performed  following the standard protocol without intravenous contrast. Multiplanar CT image reconstructions of the cervical spine were also generated. COMPARISON:  Head CT 08/26/2017. Soft tissue neck CT 10/15/2017. FINDINGS: CT HEAD FINDINGS Brain: There is no evidence of acute infarct, intracranial hemorrhage, mass, midline shift, or extra-axial fluid collection. Mild cerebral atrophy is unchanged. Vascular: Calcified atherosclerosis at the skull base. No hyperdense vessel. Skull: No fracture or suspicious osseous lesion. Sinuses/Orbits: Paranasal sinuses and mastoid air cells are clear. Unremarkable orbits. Other: None. CT CERVICAL SPINE FINDINGS Alignment: Unchanged trace degenerative anterolisthesis of C7 on T1. Skull base and vertebrae: No acute fracture. Multilevel degenerative endplate changes with flowing anterior vertebral ossification extending from C3 into the thoracic spine suggesting DISH. Soft tissues and spinal canal: No prevertebral fluid or swelling. No visible canal hematoma. Disc levels: Moderate to severe left neural foraminal stenosis at C2-3, C3-4,  and C4-5 due to uncovertebral spurring at each level as well as severe left facet arthrosis at C2-3. Mild spinal stenosis at C5-6 due to right paracentral osteophyte. Upper chest: Partially visualized small right pleural effusion. No apical lung consolidation. Other: Diffusely heterogeneous thyroid, similar to the prior CT and without a clearly dominant nodule. IMPRESSION: 1. No evidence of acute intracranial abnormality. 2. No evidence of acute cervical spine fracture or traumatic subluxation. 3. Findings of DISH in the cervical and upper thoracic spine. 4. Small right pleural effusion. Electronically Signed   By: Sebastian Ache M.D.   On: 09/07/2018 16:13      Assessment & Plan:    Active Problems:   Atrial fibrillation (HCC)   Dehydration   Acute renal failure (HCC)   BPH (benign prostatic hyperplasia)   Acute renal failure -This is secondary  to obstructive uropathy, with bilateral hydronephrosis, evidence of BPH, Foley catheter inserted, 2000 cc urine output right away, hold nephrotoxic medications, continue with IV fluid, will watch closely for postobstructive diuresis, will monitor electrolytes closely and replete as needed.  BPH -With obstructive uropathy and bilateral hydronephrosis, relieved with Foley catheter insertion, will start on Flomax, ED discussed with urology Dr. Ok Edwards, recommended to continue with Foley catheter for now. -Start on Flomax, and attempt voiding trial in couple days.  Dehdration/failure to thrive/weakness -Continue with IV fluids, will consult PT, will consult nutritionist service as well  Paroxysmal atrial fibrillation -Per my discussion with wife, cardiology stopped his Eliquis before 1 year, he is not on anticoagulation further, given his frailty, and high risk for fall, which is appropriate as he does present with fall. -He is currently in A. fib, heart rate controlled, continue with Cardizem   DVT Prophylaxis [place]  Heparin - SCDs  AM Labs Ordered, also please review Full Orders  Family Communication: Admission, patients condition and plan of care including tests being ordered have been discussed with the patient and wife who indicate understanding and agree with the plan and Code Status.  Code Status Full, confirmed by wife  Likely DC to  : Pending PT consult  Condition GUARDED    Consults called: None  Admission status: inpatient , given his significantly elevated creatinine 7.78, and sedated need for a few days of IV fluids and close monitoring  Time spent in minutes : 55 minutes   Huey Bienenstock M.D on 09/07/2018 at 6:00 PM  Between 7am to 7pm - Pager - [phone]. After 7pm go to [email or web address] - password [facility]  [facility] - Office  [phone]
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
## 2018-09-08 DIAGNOSIS — E43 Unspecified severe protein-calorie malnutrition: Secondary | ICD-10-CM

## 2018-09-08 LAB — BASIC METABOLIC PANEL
Anion gap: 11 (ref 5–15)
BUN: 71 mg/dL — ABNORMAL HIGH (ref 8–23)
CO2: 22 mmol/L (ref 22–32)
Calcium: 8.8 mg/dL — ABNORMAL LOW (ref 8.9–10.3)
Chloride: 110 mmol/L (ref 98–111)
Creatinine, Ser: 3.44 mg/dL — ABNORMAL HIGH (ref 0.61–1.24)
GFR calc Af Amer: 18 mL/min — ABNORMAL LOW (ref >60)
GFR calc non Af Amer: 16 mL/min — ABNORMAL LOW (ref >60)
Glucose, Bld: 93 mg/dL (ref 70–99)
Potassium: 3.7 mmol/L (ref 3.5–5.1)
Sodium: 143 mmol/L (ref 135–145)

## 2018-09-08 LAB — CBC
HCT: 32.3 % — ABNORMAL LOW (ref 39.0–52.0)
Hemoglobin: 10.9 g/dL — ABNORMAL LOW (ref 13.0–17.0)
MCH: 31.5 pg (ref 26.0–34.0)
MCHC: 33.7 g/dL (ref 30.0–36.0)
MCV: 93.4 fL (ref 80.0–100.0)
PLATELETS: 210 10*3/uL (ref 150–400)
RBC: 3.46 MIL/uL — ABNORMAL LOW (ref 4.22–5.81)
RDW: 12.8 % (ref 11.5–15.5)
WBC: 9.9 10*3/uL (ref 4.0–10.5)
nRBC: 0 % (ref 0.0–0.2)

## 2018-09-08 LAB — URINE CULTURE: CULTURE: NO GROWTH

## 2018-09-08 MED ORDER — VORTIOXETINE HBR 20 MG PO TABS
20.0000 mg | ORAL_TABLET | Freq: Every day | ORAL | Status: DC
  Administered 2018-09-08: 20 mg via ORAL
  Filled 2018-09-08: qty 1

## 2018-09-08 MED ORDER — BOOST / RESOURCE BREEZE PO LIQD CUSTOM
1.0000 | Freq: Three times a day (TID) | ORAL | Status: DC
  Administered 2018-09-08 – 2018-09-09 (×4): 1 via ORAL

## 2018-09-08 MED ORDER — TETRAHYDROZOLINE HCL 0.05 % OP SOLN
1.0000 [drp] | Freq: Every day | OPHTHALMIC | Status: DC | PRN
  Filled 2018-09-08: qty 15

## 2018-09-08 MED ORDER — SERTRALINE HCL 50 MG PO TABS
75.0000 mg | ORAL_TABLET | Freq: Every evening | ORAL | Status: DC
  Administered 2018-09-08: 75 mg via ORAL
  Filled 2018-09-08: qty 1

## 2018-09-08 NOTE — Progress Notes (Signed)
PROGRESS NOTE    Gary Bean  PNP:[id number] DOB: Jul 24, 1934 DOA: 09/07/2018 PCP: Blair Heys, MD   Brief Narrative:  84 year old with past medical history of essential hypertension, atrial fibrillation who was on Eliquis up until last year, anxiety, depression, BPH came to the hospital after sustaining a mechanical fall.  Family states patient has been getting extremely weak over the course the past several months with poor oral intake.  During this time he has had multiple mechanical fall and he refuses to use any sort of assistance including his cane, walker and wheelchair.  Upon admission CT of the head was negative.  Trauma work-up overall was negative except CT of the abdomen pelvis showed distended bladder with bilateral hydronephrosis.  Labs were suggestive of acute kidney injury with creatinine of 7.79.   Assessment & Plan:   Active Problems:   Atrial fibrillation (HCC)   Dehydration   Acute renal failure (HCC)   BPH (benign prostatic hyperplasia)  Acute renal failure secondary to obstructive uropathy Bilateral hydronephrosis likely from BPH -Foley catheter currently in place.  Getting IV fluids.  Initial creatinine 7.79, this is trended down to 3.44.  Will continue to give him IV fluids.  Started oral Flomax. -Suspect he will go home on 1 week of Foley and outpatient follow-up with urology in about a week for voiding trial.  Acute metabolic encephalopathy secondary to uremia -Initially BUN was 112, this is trended down to 71.  Closely monitor this.  Closely monitor his neurologic status.  Failure to thrive with severe protein calorie malnutrition - Patient has poor desire to eat and per family he is quite stubborn.  At this time with follow-up liberal diet once his renal function has improved.  History of atrial fibrillation, paroxysmal - Due to tendency of falling and history of hemoptysis, he is no longer anticoagulation.  Currently rate controlled.  Cardizem 120 mg  daily  Essential hypertension -Cardizem.  DVT prophylaxis: Subcu heparin Code Status: Family/DNR/DNI Family Communication: Wife at bedside Disposition Plan: Maintain hospital stay at least for next 24 hours until his renal function is improved and his mentation.  Consultants:   None  Procedures:   None  Antimicrobials:   None   Subjective: Patient is laying the bed, does not have any complaints.  Foley catheter in place which is draining good amount of urine. Wife is at the bedside who tells me patient has tendency of falling at home due to unsteady gait and he refuses to use any sort of cane walker or wheelchair.  He is also very reluctant to eat much.  Appetite has been poor for quite some time now. According to her patient wishes to be DNR/DNI.  Review of Systems Otherwise negative except as per HPI, including: General: Denies fever, chills, night sweats or unintended weight loss. Resp: Denies cough, wheezing, shortness of breath. Cardiac: Denies chest pain, palpitations, orthopnea, paroxysmal nocturnal dyspnea. GI: Denies abdominal pain, nausea, vomiting, diarrhea or constipation GU: Denies dysuria, frequency, hesitancy or incontinence MS: Denies muscle aches, joint pain or swelling Neuro: Denies headache, neurologic deficits (focal weakness, numbness, tingling), abnormal gait Psych: Denies anxiety, depression, SI/HI/AVH Skin: Denies new rashes or lesions ID: Denies sick contacts, exotic exposures, travel  Objective: Vitals:   09/07/18 1930 09/07/18 2123 09/08/18 0603 09/08/18 0904  BP: (!) 143/95 (!) 137/111 (!) 148/97 (!) 136/91  Pulse: 62 (!) 108 (!) 40   Resp: 14  18   Temp:  97.6 F (36.4 C) 98 F (36.7 C)  TempSrc:  Oral Oral   SpO2: 100% 100% 97%   Weight:      Height:        Intake/Output Summary (Last 24 hours) at 09/08/2018 1101 Last data filed at 09/08/2018 0600 Gross per 24 hour  Intake 648.8 ml  Output 41324 ml  Net -9476.2 ml   Filed  Weights   09/07/18 1303  Weight: 64.9 kg    Examination:  General exam: Sleeping, only alert to his name, cachectic frail appearing.  Chronically ill.  Bilateral temporal wasting. Respiratory system: Clear to auscultation. Respiratory effort normal. Cardiovascular system: S1 & S2 heard, RRR. No JVD, murmurs, rubs, gallops or clicks. No pedal edema. Gastrointestinal system: Abdomen is nondistended, soft and nontender. No organomegaly or masses felt. Normal bowel sounds heard. Central nervous system: Only alert to his name.  Grossly moves all of his extremities.  Difficult to get full neurologic exam on him. Extremities: Symmetric 4 x 5 power. Skin: No rashes, lesions or ulcers Psychiatry: Poor judgment and insight due to his mentation.    Data Reviewed:   CBC: Recent Labs  Lab 09/07/18 1430 09/08/18 0335  WBC 14.5* 9.9  NEUTROABS 13.0*  --   HGB 10.5* 10.9*  HCT 32.8* 32.3*  MCV 94.8 93.4  PLT 196 210   Basic Metabolic Panel: Recent Labs  Lab 09/07/18 1430 09/08/18 0335  NA 137 143  K 4.5 3.7  CL 105 110  CO2 21* 22  GLUCOSE 123* 93  BUN 112* 71*  CREATININE 7.79* 3.44*  CALCIUM 8.5* 8.8*   GFR: Estimated Creatinine Clearance: 15.2 mL/min (A) (by C-G formula based on SCr of 3.44 mg/dL (H)). Liver Function Tests: No results for input(s): AST, ALT, ALKPHOS, BILITOT, PROT, ALBUMIN in the last 168 hours. No results for input(s): LIPASE, AMYLASE in the last 168 hours. No results for input(s): AMMONIA in the last 168 hours. Coagulation Profile: No results for input(s): INR, PROTIME in the last 168 hours. Cardiac Enzymes: No results for input(s): CKTOTAL, CKMB, CKMBINDEX, TROPONINI in the last 168 hours. BNP (last 3 results) No results for input(s): PROBNP in the last 8760 hours. HbA1C: No results for input(s): HGBA1C in the last 72 hours. CBG: No results for input(s): GLUCAP in the last 168 hours. Lipid Profile: No results for input(s): CHOL, HDL, LDLCALC,  TRIG, CHOLHDL, LDLDIRECT in the last 72 hours. Thyroid Function Tests: No results for input(s): TSH, T4TOTAL, FREET4, T3FREE, THYROIDAB in the last 72 hours. Anemia Panel: No results for input(s): VITAMINB12, FOLATE, FERRITIN, TIBC, IRON, RETICCTPCT in the last 72 hours. Sepsis Labs: No results for input(s): PROCALCITON, LATICACIDVEN in the last 168 hours.  No results found for this or any previous visit (from the past 240 hour(s)).       Radiology Studies: Ct Abdomen Pelvis Wo Contrast  Result Date: 09/07/2018 CLINICAL DATA:  Abdominal distension. EXAM: CT ABDOMEN AND PELVIS WITHOUT CONTRAST TECHNIQUE: Multidetector CT imaging of the abdomen and pelvis was performed following the standard protocol without IV contrast. COMPARISON:  11/04/2017 FINDINGS: Lower chest: Moderate to large right effusion layering dependently with dependent atelectasis on the right. The left lung base is clear except for mild edema. Hepatobiliary: No liver parenchymal abnormality is seen. Previous cholecystectomy. Pancreas: Pancreas appears intrinsically normal. Spleen: Normal Adrenals/Urinary Tract: Adrenal glands are normal. Renal parenchyma shows some chronic cysts and nonobstructing calculi, none larger than 4 mm. There is bilateral hydroureteronephrosis, probably secondary to distended bladder/bladder outlet obstruction. Stomach/Bowel: No primary bowel pathology is seen. Vascular/Lymphatic: Diffuse  retroperitoneal and dependent body edema. Aortic atherosclerosis without aneurysm. IVC is normal. No retroperitoneal adenopathy. Reproductive: Prostate gland only appears mildly enlarged. There appears to be fluid dilatation of the prostatic urethra as well as the proximal penile urethra. Possibility of a urethral obstruction does exist. Other: No free fluid or air. Musculoskeletal: Chronic degenerative changes of the lumbar spine. Chronic pars defects at L5. IMPRESSION: Right pleural effusion layering dependently with  dependent atelectasis. Distended bladder. Bilateral hydroureteronephrosis. Findings probably secondary to outlet obstruction. There appears to be dilatation of the prostatic urethra and the proximal penile urethra, and the level of obstruction could be within the penile urethra. Prostate gland does not look that large. Previously, there had been seen to be some stones dependent in the bladder. Today there is only a small focal calcification of the right posterior bladder wall or small stone in that location. I can not definitely see a urethral stone however. Electronically Signed   By: Paulina Fusi M.D.   On: 09/07/2018 16:05   Dg Chest 2 View  Result Date: 09/07/2018 CLINICAL DATA:  Trauma secondary to a fall. EXAM: CHEST - 2 VIEW COMPARISON:  11/04/2017 FINDINGS: The heart size and pulmonary vascularity are normal. There is a new small right posterior pleural effusion. The lungs are otherwise clear except for small calcified granulomas in the left upper lobe and in both hilar regions. No acute bone abnormality. IMPRESSION: Small nonspecific right pleural effusion. No other acute abnormalities. Electronically Signed   By: Francene Boyers M.D.   On: 09/07/2018 14:16   Dg Elbow Complete Left  Result Date: 09/07/2018 CLINICAL DATA:  Left elbow laceration secondary to a fall. EXAM: LEFT ELBOW - COMPLETE 3+ VIEW COMPARISON:  None. FINDINGS: There is no evidence of fracture, dislocation, or joint effusion. There is no evidence of arthropathy or other focal bone abnormality. Soft tissues are unremarkable. IMPRESSION: Negative. Electronically Signed   By: Francene Boyers M.D.   On: 09/07/2018 14:17   Ct Head Wo Contrast  Result Date: 09/07/2018 CLINICAL DATA:  Fall. EXAM: CT HEAD WITHOUT CONTRAST CT CERVICAL SPINE WITHOUT CONTRAST TECHNIQUE: Multidetector CT imaging of the head and cervical spine was performed following the standard protocol without intravenous contrast. Multiplanar CT image reconstructions of  the cervical spine were also generated. COMPARISON:  Head CT 08/26/2017. Soft tissue neck CT 10/15/2017. FINDINGS: CT HEAD FINDINGS Brain: There is no evidence of acute infarct, intracranial hemorrhage, mass, midline shift, or extra-axial fluid collection. Mild cerebral atrophy is unchanged. Vascular: Calcified atherosclerosis at the skull base. No hyperdense vessel. Skull: No fracture or suspicious osseous lesion. Sinuses/Orbits: Paranasal sinuses and mastoid air cells are clear. Unremarkable orbits. Other: None. CT CERVICAL SPINE FINDINGS Alignment: Unchanged trace degenerative anterolisthesis of C7 on T1. Skull base and vertebrae: No acute fracture. Multilevel degenerative endplate changes with flowing anterior vertebral ossification extending from C3 into the thoracic spine suggesting DISH. Soft tissues and spinal canal: No prevertebral fluid or swelling. No visible canal hematoma. Disc levels: Moderate to severe left neural foraminal stenosis at C2-3, C3-4, and C4-5 due to uncovertebral spurring at each level as well as severe left facet arthrosis at C2-3. Mild spinal stenosis at C5-6 due to right paracentral osteophyte. Upper chest: Partially visualized small right pleural effusion. No apical lung consolidation. Other: Diffusely heterogeneous thyroid, similar to the prior CT and without a clearly dominant nodule. IMPRESSION: 1. No evidence of acute intracranial abnormality. 2. No evidence of acute cervical spine fracture or traumatic subluxation. 3. Findings of DISH  in the cervical and upper thoracic spine. 4. Small right pleural effusion. Electronically Signed   By: Sebastian Ache M.D.   On: 09/07/2018 16:13   Ct Cervical Spine Wo Contrast  Result Date: 09/07/2018 CLINICAL DATA:  Fall. EXAM: CT HEAD WITHOUT CONTRAST CT CERVICAL SPINE WITHOUT CONTRAST TECHNIQUE: Multidetector CT imaging of the head and cervical spine was performed following the standard protocol without intravenous contrast. Multiplanar CT  image reconstructions of the cervical spine were also generated. COMPARISON:  Head CT 08/26/2017. Soft tissue neck CT 10/15/2017. FINDINGS: CT HEAD FINDINGS Brain: There is no evidence of acute infarct, intracranial hemorrhage, mass, midline shift, or extra-axial fluid collection. Mild cerebral atrophy is unchanged. Vascular: Calcified atherosclerosis at the skull base. No hyperdense vessel. Skull: No fracture or suspicious osseous lesion. Sinuses/Orbits: Paranasal sinuses and mastoid air cells are clear. Unremarkable orbits. Other: None. CT CERVICAL SPINE FINDINGS Alignment: Unchanged trace degenerative anterolisthesis of C7 on T1. Skull base and vertebrae: No acute fracture. Multilevel degenerative endplate changes with flowing anterior vertebral ossification extending from C3 into the thoracic spine suggesting DISH. Soft tissues and spinal canal: No prevertebral fluid or swelling. No visible canal hematoma. Disc levels: Moderate to severe left neural foraminal stenosis at C2-3, C3-4, and C4-5 due to uncovertebral spurring at each level as well as severe left facet arthrosis at C2-3. Mild spinal stenosis at C5-6 due to right paracentral osteophyte. Upper chest: Partially visualized small right pleural effusion. No apical lung consolidation. Other: Diffusely heterogeneous thyroid, similar to the prior CT and without a clearly dominant nodule. IMPRESSION: 1. No evidence of acute intracranial abnormality. 2. No evidence of acute cervical spine fracture or traumatic subluxation. 3. Findings of DISH in the cervical and upper thoracic spine. 4. Small right pleural effusion. Electronically Signed   By: Sebastian Ache M.D.   On: 09/07/2018 16:13        Scheduled Meds:  diltiazem  120 mg Oral Daily   heparin injection (subcutaneous)  5,000 Units Subcutaneous Q8H   tamsulosin  0.4 mg Oral QPC supper   Continuous Infusions:  sodium chloride 75 mL/hr at 09/08/18 0855     LOS: 1 day   Time spent= 35  mins    Yvonnie Schinke Joline Maxcy, MD [facility]  If 7PM-7AM, please contact night-coverage [email or web address] 09/08/2018, 11:01 AM

## 2018-09-08 NOTE — Progress Notes (Signed)
Initial Nutrition Assessment  DOCUMENTATION CODES:   Severe malnutrition in context of chronic illness, Underweight  INTERVENTION:    Boost Breeze po TID, each supplement provides 250 kcal and 9 grams of protein  NUTRITION DIAGNOSIS:   Severe Malnutrition related to chronic illness as evidenced by severe fat depletion, severe muscle depletion, 39% weight loss x 1 year  GOAL:   Patient will meet greater than or equal to 90% of their needs  MONITOR:   PO intake, Supplement acceptance, Labs, Skin, Weight trends, I & O's  REASON FOR ASSESSMENT:   Consult Assessment of nutrition requirement/status  ASSESSMENT:   84 yo Male with of essential hypertension, atrial fibrillation who was on Eliquis up until last year, anxiety, depression, BPH came to the hospital after sustaining a mechanical fall.     Admit dx include: Abrasion [T14.8XXA] AKI (acute kidney injury) (HCC) [N17.9] Fall in home, initial encounter [W19.XXXA, Y92.009] Bladder outlet obstruction [N32.0]  RD spoke with pt's wife; pt is sleeping. Wife reports pt ate well at breakfast but not at lunch today. He was consuming mostly liquids with crackers for several days PTA.  Wife occasionally made him homemade nutrition shakes. Pt's wife states pt "hates" Ensure or Boost supplements. She was amenable to RD ordering Boost Breeze.  Pt has had severe progressive weight loss since February 2019. Per wife pt was 236 lbs at that time; total loss has been 92 lbs. Labs & medications reviewed.  NUTRITION - FOCUSED PHYSICAL EXAM:    Most Recent Value  Orbital Region  Severe depletion  Upper Arm Region  Severe depletion  Thoracic and Lumbar Region  Unable to assess  Buccal Region  Severe depletion  Temple Region  Severe depletion  Clavicle Bone Region  Severe depletion  Clavicle and Acromion Bone Region  Severe depletion  Scapular Bone Region  Unable to assess  Dorsal Hand  Severe depletion  Patellar Region  Severe  depletion  Anterior Thigh Region  Severe depletion  Posterior Calf Region  Severe depletion  Edema (RD Assessment)  None     Diet Order:   Diet Order            Diet regular Room service appropriate? Yes; Fluid consistency: Thin  Diet effective now             EDUCATION NEEDS:   Not appropriate for education at this time  Skin:  Skin Assessment: Reviewed RN Assessment  Last BM:  PTA  Height:   Ht Readings from Last 1 Encounters:  09/07/18 6\' 1"  (1.854 m)   Weight:   Wt Readings from Last 1 Encounters:  09/07/18 64.9 kg   BMI:  Body mass index is 18.87 kg/m.  Estimated Nutritional Needs:   Kcal:  1600-1800  Protein:  80-95 gm  Fluid:  1.6-1.8 L  Maureen Chatters, RD, LDN Pager #: [phone] After-Hours Pager #: [phone]

## 2018-09-08 NOTE — Progress Notes (Signed)
Orthopedic Tech Progress Note Patient Details:  Gary Bean [birth year] [id number] Called in brace order  Patient ID: Gary Bean, male   DOB: 1935/01/02, 84 y.o.   MRN: [id number]   Donald Pore 09/08/2018, 10:23 AM

## 2018-09-08 NOTE — Progress Notes (Signed)
PT Cancellation Note  Patient Details Name: Gary Bean MRN: [id number] DOB: 10-26-1934   Cancelled Treatment:    Reason Eval/Treat Not Completed: Medical issues which prohibited therapy per chart review patient with variable HR ranging from 40-115BPM at rest. Holding PT today due to HR fluctuations and elevated HR at rest. Will attempt to perform skilled evaluation on next day of service.    Nedra Hai PT, DPT, CBIS  Supplemental Physical Therapist [facility]    Pager [phone] Acute Rehab Office [phone]

## 2018-09-08 NOTE — Progress Notes (Signed)
OT Cancellation Note  Patient Details Name: Gary EDWARDSEN MRN: [id number] DOB: 1935/01/29   Cancelled Treatment:    Reason Eval/Treat Not Completed: Medical issues which prohibited therapy OT order received, pt chart reviewed. Noted significant HR fluctuations this date, will hold OT for next date to ensure stabilization. Will continue to follow pt as available and appropriate to initiate OT POC.   Dalphine Handing, MSOT, OTR/L Behavioral Health OT/ Acute Relief OT [facility] Office: [phone]  Dalphine Handing 09/08/2018, 4:07 PM

## 2018-09-09 DIAGNOSIS — E43 Unspecified severe protein-calorie malnutrition: Secondary | ICD-10-CM

## 2018-09-09 LAB — MAGNESIUM: MAGNESIUM: 1.6 mg/dL — AB (ref 1.7–2.4)

## 2018-09-09 LAB — CBC
HCT: 30.4 % — ABNORMAL LOW (ref 39.0–52.0)
Hemoglobin: 10.3 g/dL — ABNORMAL LOW (ref 13.0–17.0)
MCH: 31.7 pg (ref 26.0–34.0)
MCHC: 33.9 g/dL (ref 30.0–36.0)
MCV: 93.5 fL (ref 80.0–100.0)
Platelets: 193 10*3/uL (ref 150–400)
RBC: 3.25 MIL/uL — ABNORMAL LOW (ref 4.22–5.81)
RDW: 12.8 % (ref 11.5–15.5)
WBC: 9.3 10*3/uL (ref 4.0–10.5)
nRBC: 0 % (ref 0.0–0.2)

## 2018-09-09 LAB — BASIC METABOLIC PANEL
Anion gap: 8 (ref 5–15)
BUN: 28 mg/dL — ABNORMAL HIGH (ref 8–23)
CO2: 26 mmol/L (ref 22–32)
Calcium: 8.6 mg/dL — ABNORMAL LOW (ref 8.9–10.3)
Chloride: 115 mmol/L — ABNORMAL HIGH (ref 98–111)
Creatinine, Ser: 1.09 mg/dL (ref 0.61–1.24)
GFR calc Af Amer: >60 mL/min (ref >60)
Glucose, Bld: 92 mg/dL (ref 70–99)
Potassium: 3.3 mmol/L — ABNORMAL LOW (ref 3.5–5.1)
Sodium: 149 mmol/L — ABNORMAL HIGH (ref 135–145)

## 2018-09-09 MED ORDER — TAMSULOSIN HCL 0.4 MG PO CAPS: 0.4000 mg | ORAL_CAPSULE | Freq: Every day | ORAL | 0 refills | Status: AC

## 2018-09-09 MED ORDER — SODIUM CHLORIDE 0.45 % IV BOLUS
500.0000 mL | Freq: Once | INTRAVENOUS | Status: AC
  Administered 2018-09-09: 500 mL via INTRAVENOUS

## 2018-09-09 MED ORDER — MAGNESIUM OXIDE 400 (241.3 MG) MG PO TABS
800.0000 mg | ORAL_TABLET | Freq: Once | ORAL | Status: AC
  Administered 2018-09-09: 800 mg via ORAL
  Filled 2018-09-09: qty 2

## 2018-09-09 MED ORDER — POTASSIUM CHLORIDE CRYS ER 20 MEQ PO TBCR
40.0000 meq | EXTENDED_RELEASE_TABLET | Freq: Once | ORAL | Status: AC
  Administered 2018-09-09: 40 meq via ORAL
  Filled 2018-09-09: qty 2

## 2018-09-09 MED FILL — TAMSULOSIN HCL 0.4 MG CAP: 0.4 | 30 days supply | Qty: 30 | Fill #0

## 2018-09-09 NOTE — Discharge Summary (Signed)
Physician Discharge Summary  Gary Bean ZOX:[id number] DOB: 10/01/1934 DOA: 09/07/2018  PCP: Blair HeysEhinger, Robert, MD  Admit date: 09/07/2018 Discharge date: 09/09/2018  Admitted From: Home Disposition: Home with home health  Recommendations for Outpatient Follow-up:  1. Follow up with PCP in 1-2 weeks 2. Please obtain BMP/CBC in one week your next doctors visit.  3. Flomax ordered, he will be going home with Foley catheter with outpatient urology in 1 week for voiding trial.  Home Health: Arrangements to be made Equipment/Devices: Foley catheter Discharge Condition: Stable CODE STATUS: DNR Diet recommendation: Regular  Brief/Interim Summary: 84 year old with past medical history of essential hypertension, atrial fibrillation who was on Eliquis up until last year, anxiety, depression, BPH came to the hospital after sustaining a mechanical fall.  Family states patient has been getting extremely weak over the course the past several months with poor oral intake.  During this time he has had multiple mechanical fall and he refuses to use any sort of assistance including his cane, walker and wheelchair.  Upon admission CT of the head was negative.  Trauma work-up overall was negative except CT of the abdomen pelvis showed distended bladder with bilateral hydronephrosis.  Labs were suggestive of acute kidney injury with creatinine of 7.79.  With couple of days of IV fluid, his renal function improved to 1.09.  His urine became more clear.  He was tolerating Flomax well.  Physical therapy recommended home health therefore arrangements were made.  Patient's wife has been extensively educated about Foley care.  We will plan on keeping this Foley catheter in for a week and follow-up patient urology.  This should help his bilateral hydronephrosis in the meantime as well.  Would benefit from repeat renal ultrasound likely in 1-2 weeks. No evidence of trauma, CT cervical spine shows DISH.  Advised stretching  exercise and pain medications for this.  Maximal benefit from hospital stay and stable for discharge today.   Discharge Diagnoses:  Active Problems:   Atrial fibrillation (HCC)   Dehydration   Acute renal failure (HCC)   BPH (benign prostatic hyperplasia)   Protein-calorie malnutrition, severe  Acute renal failure secondary to obstructive uropathy Bilateral hydronephrosis likely from BPH -Foley catheter currently in place.  Getting IV fluids.  Initial creatinine 7.79, creatinine now has trended down to 1.09.  He will go home with Foley catheter for 1 week and follow-up outpatient urology-voiding trial can be performed at that time.  Needs to see primary care physician in 1 week and get lab work done to ensure renal function has remained stable.  Would also benefit from repeating renal ultrasound to ensure bilateral hydronephrosis has improved/resolved.  Acute metabolic encephalopathy secondary to uremia -Neurologic function has greatly improved.  BUN is trended down to around baseline.  Mentation is clear.  Hypokalemia and hypomagnesemia -Repletion ordered.  Failure to thrive with severe protein calorie malnutrition - Patient has poor desire to eat and per family he is quite stubborn.  At this time with follow-up liberal diet once his renal function has improved.  Encourage oral diet as much as possible.  History of atrial fibrillation, paroxysmal - Due to tendency of falling and history of hemoptysis, he is no longer anticoagulation.  Currently rate controlled.  Cardizem 120 mg daily  Essential hypertension -Cardizem.  Consultations:  None  Subjective: Patient is sitting up in the chair eating his breakfast.  Per wife his mentation is back to baseline and does not have any complaints.  Discharge Exam: Vitals:  09/09/18 0521 09/09/18 0853  BP: 135/83 132/80  Pulse: 98 82  Resp: 18 20  Temp: 97.8 F (36.6 C) 98 F (36.7 C)  SpO2: 100% 98%   Vitals:   09/08/18  1253 09/08/18 2225 09/09/18 0521 09/09/18 0853  BP: 138/88 117/86 135/83 132/80  Pulse: (!) 115 (!) 113 98 82  Resp: 18 18 18 20   Temp: 98.6 F (37 C) 98.3 F (36.8 C) 97.8 F (36.6 C) 98 F (36.7 C)  TempSrc: Oral Oral Oral Oral  SpO2: 97% 98% 100% 98%  Weight:      Height:        General: Pt is alert, awake, not in acute distress Cardiovascular: RRR, S1/S2 +, no rubs, no gallops Respiratory: CTA bilaterally, no wheezing, no rhonchi Abdominal: Soft, NT, ND, bowel sounds + Extremities: no edema, no cyanosis Chronically frail-appearing with bilateral temporal wasting.  Discharge Instructions   Allergies as of 09/09/2018   No Known Allergies     Medication List    TAKE these medications   acetaminophen 500 MG tablet Commonly known as:  TYLENOL Take 500 mg by mouth every 6 (six) hours as needed for mild pain, moderate pain or headache.   ALIGN PO Take 1 tablet by mouth daily.   diltiazem 240 MG 24 hr capsule Commonly known as:  CARDIZEM CD TAKE 1 CAPSULE (240 MG TOTAL) BY MOUTH DAILY.   diltiazem 120 MG 24 hr capsule Commonly known as:  CARDIZEM CD Take 120 mg by mouth daily.   Eliquis 5 MG Tabs tablet Generic drug:  apixaban TAKE 1 TABLET BY MOUTH TWICE A DAY   sertraline 50 MG tablet Commonly known as:  ZOLOFT Take 1.5 tablets (75 mg total) by mouth daily. What changed:    when to take this  additional instructions   tamsulosin 0.4 MG Caps capsule Commonly known as:  FLOMAX Take 1 capsule (0.4 mg total) by mouth daily after supper for 30 days.   traZODone 50 MG tablet Commonly known as:  DESYREL Take 0.5 tablets (25 mg total) by mouth at bedtime.   Trintellix 20 MG Tabs tablet Generic drug:  vortioxetine HBr Take 20 mg by mouth at bedtime.   VISINE OP Place 1 drop into both eyes daily as needed (dry eyes).      Follow-up Information    Blair Heys, MD. Schedule an appointment as soon as possible for a visit in 1 week(s).   Specialty:   Family Medicine Contact information: [address] [place] [address] [phone]        [facility]. Schedule an appointment as soon as possible for a visit in 1 week(s).   Why:  Voiding trial, foley removal.  Contact information: [address] [place] [address] [phone]         No Known Allergies  You were cared for by a hospitalist during your hospital stay. If you have any questions about your discharge medications or the care you received while you were in the hospital after you are discharged, you can call the unit and asked to speak with the hospitalist on call if the hospitalist that took care of you is not available. Once you are discharged, your primary care physician will handle any further medical issues. Please note that no refills for any discharge medications will be authorized once you are discharged, as it is imperative that you return to your primary care physician (or establish a relationship with  a primary care physician if you do not have one) for your aftercare needs so that they can reassess your need for medications and monitor your lab values.   Procedures/Studies: Ct Abdomen Pelvis Wo Contrast  Result Date: 09/07/2018 CLINICAL DATA:  Abdominal distension. EXAM: CT ABDOMEN AND PELVIS WITHOUT CONTRAST TECHNIQUE: Multidetector CT imaging of the abdomen and pelvis was performed following the standard protocol without IV contrast. COMPARISON:  11/04/2017 FINDINGS: Lower chest: Moderate to large right effusion layering dependently with dependent atelectasis on the right. The left lung base is clear except for mild edema. Hepatobiliary: No liver parenchymal abnormality is seen. Previous cholecystectomy. Pancreas: Pancreas appears intrinsically normal. Spleen: Normal Adrenals/Urinary Tract: Adrenal glands are normal. Renal parenchyma shows some chronic cysts and nonobstructing calculi, none larger than 4 mm.  There is bilateral hydroureteronephrosis, probably secondary to distended bladder/bladder outlet obstruction. Stomach/Bowel: No primary bowel pathology is seen. Vascular/Lymphatic: Diffuse retroperitoneal and dependent body edema. Aortic atherosclerosis without aneurysm. IVC is normal. No retroperitoneal adenopathy. Reproductive: Prostate gland only appears mildly enlarged. There appears to be fluid dilatation of the prostatic urethra as well as the proximal penile urethra. Possibility of a urethral obstruction does exist. Other: No free fluid or air. Musculoskeletal: Chronic degenerative changes of the lumbar spine. Chronic pars defects at L5. IMPRESSION: Right pleural effusion layering dependently with dependent atelectasis. Distended bladder. Bilateral hydroureteronephrosis. Findings probably secondary to outlet obstruction. There appears to be dilatation of the prostatic urethra and the proximal penile urethra, and the level of obstruction could be within the penile urethra. Prostate gland does not look that large. Previously, there had been seen to be some stones dependent in the bladder. Today there is only a small focal calcification of the right posterior bladder wall or small stone in that location. I can not definitely see a urethral stone however. Electronically Signed   By: Paulina Fusi M.D.   On: 09/07/2018 16:05   Dg Chest 2 View  Result Date: 09/07/2018 CLINICAL DATA:  Trauma secondary to a fall. EXAM: CHEST - 2 VIEW COMPARISON:  11/04/2017 FINDINGS: The heart size and pulmonary vascularity are normal. There is a new small right posterior pleural effusion. The lungs are otherwise clear except for small calcified granulomas in the left upper lobe and in both hilar regions. No acute bone abnormality. IMPRESSION: Small nonspecific right pleural effusion. No other acute abnormalities. Electronically Signed   By: Francene Boyers M.D.   On: 09/07/2018 14:16   Dg Elbow Complete Left  Result Date:  09/07/2018 CLINICAL DATA:  Left elbow laceration secondary to a fall. EXAM: LEFT ELBOW - COMPLETE 3+ VIEW COMPARISON:  None. FINDINGS: There is no evidence of fracture, dislocation, or joint effusion. There is no evidence of arthropathy or other focal bone abnormality. Soft tissues are unremarkable. IMPRESSION: Negative. Electronically Signed   By: Francene Boyers M.D.   On: 09/07/2018 14:17   Ct Head Wo Contrast  Result Date: 09/07/2018 CLINICAL DATA:  Fall. EXAM: CT HEAD WITHOUT CONTRAST CT CERVICAL SPINE WITHOUT CONTRAST TECHNIQUE: Multidetector CT imaging of the head and cervical spine was performed following the standard protocol without intravenous contrast. Multiplanar CT image reconstructions of the cervical spine were also generated. COMPARISON:  Head CT 08/26/2017. Soft tissue neck CT 10/15/2017. FINDINGS: CT HEAD FINDINGS Brain: There is no evidence of acute infarct, intracranial hemorrhage, mass, midline shift, or extra-axial fluid collection. Mild cerebral atrophy is unchanged. Vascular: Calcified atherosclerosis at the skull base. No hyperdense vessel. Skull: No fracture or suspicious osseous lesion.  Sinuses/Orbits: Paranasal sinuses and mastoid air cells are clear. Unremarkable orbits. Other: None. CT CERVICAL SPINE FINDINGS Alignment: Unchanged trace degenerative anterolisthesis of C7 on T1. Skull base and vertebrae: No acute fracture. Multilevel degenerative endplate changes with flowing anterior vertebral ossification extending from C3 into the thoracic spine suggesting DISH. Soft tissues and spinal canal: No prevertebral fluid or swelling. No visible canal hematoma. Disc levels: Moderate to severe left neural foraminal stenosis at C2-3, C3-4, and C4-5 due to uncovertebral spurring at each level as well as severe left facet arthrosis at C2-3. Mild spinal stenosis at C5-6 due to right paracentral osteophyte. Upper chest: Partially visualized small right pleural effusion. No apical lung  consolidation. Other: Diffusely heterogeneous thyroid, similar to the prior CT and without a clearly dominant nodule. IMPRESSION: 1. No evidence of acute intracranial abnormality. 2. No evidence of acute cervical spine fracture or traumatic subluxation. 3. Findings of DISH in the cervical and upper thoracic spine. 4. Small right pleural effusion. Electronically Signed   By: Sebastian Ache M.D.   On: 09/07/2018 16:13   Ct Cervical Spine Wo Contrast  Result Date: 09/07/2018 CLINICAL DATA:  Fall. EXAM: CT HEAD WITHOUT CONTRAST CT CERVICAL SPINE WITHOUT CONTRAST TECHNIQUE: Multidetector CT imaging of the head and cervical spine was performed following the standard protocol without intravenous contrast. Multiplanar CT image reconstructions of the cervical spine were also generated. COMPARISON:  Head CT 08/26/2017. Soft tissue neck CT 10/15/2017. FINDINGS: CT HEAD FINDINGS Brain: There is no evidence of acute infarct, intracranial hemorrhage, mass, midline shift, or extra-axial fluid collection. Mild cerebral atrophy is unchanged. Vascular: Calcified atherosclerosis at the skull base. No hyperdense vessel. Skull: No fracture or suspicious osseous lesion. Sinuses/Orbits: Paranasal sinuses and mastoid air cells are clear. Unremarkable orbits. Other: None. CT CERVICAL SPINE FINDINGS Alignment: Unchanged trace degenerative anterolisthesis of C7 on T1. Skull base and vertebrae: No acute fracture. Multilevel degenerative endplate changes with flowing anterior vertebral ossification extending from C3 into the thoracic spine suggesting DISH. Soft tissues and spinal canal: No prevertebral fluid or swelling. No visible canal hematoma. Disc levels: Moderate to severe left neural foraminal stenosis at C2-3, C3-4, and C4-5 due to uncovertebral spurring at each level as well as severe left facet arthrosis at C2-3. Mild spinal stenosis at C5-6 due to right paracentral osteophyte. Upper chest: Partially visualized small right pleural  effusion. No apical lung consolidation. Other: Diffusely heterogeneous thyroid, similar to the prior CT and without a clearly dominant nodule. IMPRESSION: 1. No evidence of acute intracranial abnormality. 2. No evidence of acute cervical spine fracture or traumatic subluxation. 3. Findings of DISH in the cervical and upper thoracic spine. 4. Small right pleural effusion. Electronically Signed   By: Sebastian Ache M.D.   On: 09/07/2018 16:13      The results of significant diagnostics from this hospitalization (including imaging, microbiology, ancillary and laboratory) are listed below for reference.     Microbiology: Recent Results (from the past 240 hour(s))  Urine culture     Status: None   Collection Time: 09/07/18  1:34 PM  Result Value Ref Range Status   Specimen Description URINE, RANDOM  Final   Special Requests NONE  Final   Culture   Final    NO GROWTH Performed at [facility], [address] [place], [place] [address]    Report Status 09/08/2018 FINAL  Final     Labs: BNP (last 3 results) No results for input(s): BNP in the last 8760 hours. Basic Metabolic  Panel: Recent Labs  Lab 09/07/18 1430 09/08/18 0335 09/09/18 0416  NA 137 143 149*  K 4.5 3.7 3.3*  CL 105 110 115*  CO2 21* 22 26  GLUCOSE 123* 93 92  BUN 112* 71* 28*  CREATININE 7.79* 3.44* 1.09  CALCIUM 8.5* 8.8* 8.6*  MG  --   --  1.6*   Liver Function Tests: No results for input(s): AST, ALT, ALKPHOS, BILITOT, PROT, ALBUMIN in the last 168 hours. No results for input(s): LIPASE, AMYLASE in the last 168 hours. No results for input(s): AMMONIA in the last 168 hours. CBC: Recent Labs  Lab 09/07/18 1430 09/08/18 0335 09/09/18 0416  WBC 14.5* 9.9 9.3  NEUTROABS 13.0*  --   --   HGB 10.5* 10.9* 10.3*  HCT 32.8* 32.3* 30.4*  MCV 94.8 93.4 93.5  PLT 196 210 193   Cardiac Enzymes: No results for input(s): CKTOTAL, CKMB, CKMBINDEX, TROPONINI in the last 168 hours. BNP: Invalid input(s):  POCBNP CBG: No results for input(s): GLUCAP in the last 168 hours. D-Dimer No results for input(s): DDIMER in the last 72 hours. Hgb A1c No results for input(s): HGBA1C in the last 72 hours. Lipid Profile No results for input(s): CHOL, HDL, LDLCALC, TRIG, CHOLHDL, LDLDIRECT in the last 72 hours. Thyroid function studies No results for input(s): TSH, T4TOTAL, T3FREE, THYROIDAB in the last 72 hours.  Invalid input(s): FREET3 Anemia work up No results for input(s): VITAMINB12, FOLATE, FERRITIN, TIBC, IRON, RETICCTPCT in the last 72 hours. Urinalysis    Component Value Date/Time   COLORURINE YELLOW 09/07/2018 1711   APPEARANCEUR CLEAR 09/07/2018 1711   LABSPEC 1.010 09/07/2018 1711   PHURINE 5.0 09/07/2018 1711   GLUCOSEU NEGATIVE 09/07/2018 1711   HGBUR LARGE (A) 09/07/2018 1711   BILIRUBINUR NEGATIVE 09/07/2018 1711   KETONESUR NEGATIVE 09/07/2018 1711   PROTEINUR NEGATIVE 09/07/2018 1711   NITRITE NEGATIVE 09/07/2018 1711   LEUKOCYTESUR SMALL (A) 09/07/2018 1711   Sepsis Labs Invalid input(s): PROCALCITONIN,  WBC,  LACTICIDVEN Microbiology Recent Results (from the past 240 hour(s))  Urine culture     Status: None   Collection Time: 09/07/18  1:34 PM  Result Value Ref Range Status   Specimen Description URINE, RANDOM  Final   Special Requests NONE  Final   Culture   Final    NO GROWTH Performed at [facility], [address], [place], [place] [address]    Report Status 09/08/2018 FINAL  Final     Time coordinating discharge:  I have spent 35 minutes face to face with the patient and on the ward discussing the patients care, assessment, plan and disposition with other care givers. >50% of the time was devoted counseling the patient about the risks and benefits of treatment/Discharge disposition and coordinating care.   SIGNED:   Dimple Nanas, MD  [facility] 09/09/2018, 11:20 AM   If 7PM-7AM, please contact night-coverage [email or web address]

## 2018-09-09 NOTE — Evaluation (Signed)
Occupational Therapy Evaluation Patient Details Name: Gary Bean MRN: [id number] DOB: 08/16/1934 Today's Date: 09/09/2018    History of Present Illness  Gary Bean  is a 84 y.o. male, with past medical history of hypertension, A. fib, of Eliquis for last year, anxiety, depression, BPH, presents to ED secondary to mechanical fall; patient has been progressively declining over last few month, less appetite, poor oral intake, more depressed per his wife, unsteady gait, wobbly, for last 3 weeks he was noted to have some abdominal distention;  CT abdomen pelvis was obtained significant for BPH, distended bladder and bilateral hydronephrosis-foley placed.   Clinical Impression   This 84 yo male admitted with above presents to acute OT with decreased balance and increased HR at rest and even more increased with activity thus affecting his safety and independence with basic ADLs. He will benefit from acute OT with follow up HHOT.     Follow Up Recommendations  Home health OT;Supervision/Assistance - 24 hour    Equipment Recommendations  None recommended by OT       Precautions / Restrictions Precautions Precaution Comments: watch HR: 100-110s at beginning as high as 150s with up to recliner; c-collar (no order in chart)--per wife she was told in Ed for pt to wear it 6-7 days (CT negative for fracture or subluxation) Restrictions Weight Bearing Restrictions: No      Mobility Bed Mobility Overal bed mobility: Needs Assistance Bed Mobility: Supine to Sit     Supine to sit: Supervision        Transfers Overall transfer level: Needs assistance   Transfers: Sit to/from Stand;Stand Pivot Transfers Sit to Stand: Min guard Stand pivot transfers: Min assist            Balance Overall balance assessment: Needs assistance Sitting-balance support: No upper extremity supported;Feet supported Sitting balance-Leahy Scale: Good     Standing balance support: No upper extremity  supported Standing balance-Leahy Scale: Fair                             ADL either performed or assessed with clinical judgement   ADL Overall ADL's : Needs assistance/impaired Eating/Feeding: Supervision/ safety;Sitting Eating/Feeding Details (indicate cue type and reason): due to coughing with drinking gingerale and needing to spit it out (per wife he drinks too fast). Did not do this with B[place]ze Grooming: Supervision/safety;Set up;Sitting   Upper Body Bathing: Set up;Supervision/ safety;Sitting Upper Body Bathing Details (indicate cue type and reason): Wife reports she does this most of time with pt helping some Lower Body Bathing: Minimal assistance Lower Body Bathing Details (indicate cue type and reason): min guard A sit<>stand; wife reports she does this most of the time with pt helping some Upper Body Dressing : Minimal assistance Upper Body Dressing Details (indicate cue type and reason): wife reports she does this most of the time, with pt helping some Lower Body Dressing: Moderate assistance Lower Body Dressing Details (indicate cue type and reason): min guard A sit<>stand; wife reports she does this most of the time with pt helping some Toilet Transfer: Minimal assistance;Ambulation;Stand-pivot Toilet Transfer Details (indicate cue type and reason): bed>recliner only due to increased HR with activity Toileting- Clothing Manipulation and Hygiene: Moderate assistance Toileting - Clothing Manipulation Details (indicate cue type and reason): min guard A sit<>stand             Vision Baseline Vision/History: Wears glasses Wears Glasses: At all times Patient Visual Report: No  change from baseline              Pertinent Vitals/Pain Pain Assessment: Faces Faces Pain Scale: Hurts a little bit Pain Location: neck Pain Descriptors / Indicators: Sore Pain Intervention(s): Limited activity within patient's tolerance;Monitored during session(c-collar on)      Hand Dominance Right   Extremity/Trunk Assessment Upper Extremity Assessment Upper Extremity Assessment: Generalized weakness           Communication Communication Communication: No difficulties   Cognition Arousal/Alertness: Awake/alert Behavior During Therapy: WFL for tasks assessed/performed Overall Cognitive Status: Within Functional Limits for tasks assessed                                 General Comments: pt followed one step commands; oriented to person, birthdate, , and 2018--did not know [place]              Home Living Family/patient expects to be discharged to:: Private residence Living Arrangements: Spouse/significant other Available Help at Discharge: Family;Available 24 hours/day Type of Home: House Home Access: Ramped entrance(does have one threshold step into house)     Home Layout: One level     Bathroom Shower/Tub: Tub/shower unit(wife does sponge bath with pt in bathroom or bedroom)   Bathroom Toilet: Standard Bathroom Accessibility: No   Home Equipment: None   Additional Comments: Pt does not want to use any AD      Prior Functioning/Environment Level of Independence: Needs assistance  Gait / Transfers Assistance Needed: S without AD ADL's / Homemaking Assistance Needed: Wife A with bathing and dressing; pt does his own toileting--wears Depends Communication / Swallowing Assistance Needed: Pt does cough with cabonated beverages--wife reports he does this at home as well--tries to drink too fast. Does not cough with non-carbonated beverages per wife          OT Problem List: Impaired balance (sitting and/or standing)         OT Goals(Current goals can be found in the care plan section) Acute Rehab OT Goals Patient Stated Goal: wife wants pt home  OT Frequency:                AM-PAC OT "6 Clicks" Daily Activity     Outcome Measure Help from another person eating meals?: A Little Help from  another person taking care of personal grooming?: A Little Help from another person toileting, which includes using toliet, bedpan, or urinal?: A Lot Help from another person bathing (including washing, rinsing, drying)?: A Lot Help from another person to put on and taking off regular upper body clothing?: A Little Help from another person to put on and taking off regular lower body clothing?: A Lot 6 Click Score: 15   End of Session Equipment Utilized During Treatment: Gait belt Nurse Communication: Mobility status(RN in room when I got pt up to recliner)  Activity Tolerance: Other (comment)(limited by increase in HR) Patient left: in chair;with call bell/phone within reach;with chair alarm set;with family/visitor present  OT Visit Diagnosis: Unsteadiness on feet (R26.81);History of falling (Z91.81);Muscle weakness (generalized) (M62.81)                Time: 3254-9826 OT Time Calculation (min): 44 min Charges:  OT General Charges $OT Visit: 1 Visit OT Evaluation $OT Eval Moderate Complexity: 1 Mod OT Treatments $Self Care/Home Management : 23-37 mins  Ignacia Palma, OTR/L Acute [employer] Pager [phone] Office [phone]  Evette Georges 09/09/2018, 9:45 AM

## 2018-09-09 NOTE — TOC Transition Note (Addendum)
Transition of Care [facility]) - CM/SW Discharge Note   Patient Details  Name: STRYKER JALLOW MRN: [id number] Date of Birth: Mar 17, 1935  Transition of [facility]) CM/SW Contact:  Epifanio Lesches, RN Phone Number: 09/09/2018, 12:31 PM   Clinical Narrative:    Pt admitted with mechanical fall / acute renal failure secondary to obstructive uropathy ,  hx of essential hypertension, atrial fibrillation. From home with wife.  Willson Grate (Spouse)      [phone]      RWC:HJSCBI [place]  Pt will d/c with foley catheter and will f/u outpatient with the urologist in 1 week for voiding trial.  Wife to provide transportation to home.  Final next level of care: Home w Home Health Services Barriers to Discharge: Barriers Resolved   Patient Goals and CMS Choice Patient states their goals for this hospitalization and ongoing recovery are:: to go home and position myself to care for myself CMS Medicare.gov Compare Post Acute Care list provided to:: Patient Choice offered to / list presented to : Patient, Spouse      Discharge Plan and Services  [facility] accepted pt to provide home health services ( RN,PT,OT, Nurse Aide).      Social Determinants of Health (SDOH) Interventions     Readmission Risk Interventions No flowsheet data found.

## 2018-09-09 NOTE — Plan of Care (Signed)
  Problem: Spiritual Needs Goal: Ability to function at adequate level Outcome: Adequate for Discharge   

## 2018-09-09 NOTE — Progress Notes (Signed)
Physical Therapy Evaluation Patient Details Name: Gary Bean MRN: [id number] DOB: [birth year] Today's Date: 09/09/2018   History of Present Illness   Gary Bean  is a 84 y.o. male, with past medical history of hypertension, A. fib, of Eliquis for last year, anxiety, depression, BPH, presents to ED secondary to mechanical fall; patient has been progressively declining over last few month, less appetite, poor oral intake, more depressed per his wife, unsteady gait, wobbly, for last 3 weeks he was noted to have some abdominal distention;  CT abdomen pelvis was obtained significant for BPH, distended bladder and bilateral hydronephrosis,  Clinical Impression  Patient received up in recliner, wife present. Flat affect, minimally verbal. Agrees to PT evaluation. Patient requires min guard for sit to stand transfer from recliner with cues for safety/hand placement. Patient steady initially with static standing holding to RW. Ambulated 3 feet and then began to panic, stating he was going to fall, letting go of RW. Required max cues to remain safe until chair could be pulled up behind for him to sit back down. Patient then stood once more and performed standing marching x 3 reps, reporting he was weak and wanted to return to sitting. Patient will benefit from continued skilled PT to address his LE weakness, fall prevention and difficulty walking.      Follow Up Recommendations Home health PT;Supervision/Assistance - 24 hour;Supervision for mobility/OOB    Equipment Recommendations  None recommended by PT    Recommendations for Other Services       Precautions / Restrictions Precautions Precautions: Fall Precaution Comments: OT came during my evaluation, per MD patient does not need C collar. MD states okay to see patient despite HR fluctuations.   Restrictions Weight Bearing Restrictions: No      Mobility  Bed Mobility Overal bed mobility: Needs Assistance Bed Mobility: Supine to Sit      Supine to sit: Supervision     General bed mobility comments: patient received in recliner and remained in recliner  Transfers Overall transfer level: Needs assistance Equipment used: Rolling walker (2 wheeled) Transfers: Sit to/from Stand Sit to Stand: Min guard Stand pivot transfers: Min assist       General transfer comment: patient transfers with min guard and cues for hand placement/safety  Ambulation/Gait Ambulation/Gait assistance: Min assist;Mod assist Gait Distance (Feet): 3 Feet Assistive device: Rolling walker (2 wheeled) Gait Pattern/deviations: Step-to pattern;Narrow base of support;Decreased step length - right;Decreased step length - left Gait velocity: decreased   General Gait Details: patient began walking and then just stopped, let go of walker stating he was going to fall, required mod assist +2 and cues to maintain balance, chair was pulled up behind him  Stairs            Wheelchair Mobility    Modified Rankin (Stroke Patients Only)       Balance Overall balance assessment: Needs assistance;History of Falls Sitting-balance support: Single extremity supported;Feet supported Sitting balance-Leahy Scale: Good     Standing balance support: Bilateral upper extremity supported Standing balance-Leahy Scale: Fair Standing balance comment: patient has good static standing balance, unsteady with dynamic standing activities                             Pertinent Vitals/Pain Pain Assessment: Faces Faces Pain Scale: Hurts a little bit Pain Location: back Pain Descriptors / Indicators: Discomfort Pain Intervention(s): Monitored during session    Home Living Family/patient expects to be  discharged to:: Private residence Living Arrangements: Spouse/significant other Available Help at Discharge: Available 24 hours/day;Family Type of Home: House Home Access: Ramped entrance     Home Layout: One level Home Equipment: Wheelchair -  [employer] - 4 wheels;Cane - single point Additional Comments: has AD, does not want to use    Prior Function Level of Independence: Needs assistance   Gait / Transfers Assistance Needed: S without AD  ADL's / Homemaking Assistance Needed: Wife A with bathing and dressing; pt does his own toileting--wears Depends        Hand Dominance   Dominant Hand: Right    Extremity/Trunk Assessment   Upper Extremity Assessment Upper Extremity Assessment: Defer to OT evaluation    Lower Extremity Assessment Lower Extremity Assessment: Generalized weakness       Communication   Communication: No difficulties  Cognition Arousal/Alertness: Awake/alert Behavior During Therapy: Flat affect Overall Cognitive Status: Within Functional Limits for tasks assessed                                 General Comments: pt followed one step commands; oriented to person, birthdate, [place], and 2018--did not know [facility]      General Comments      Exercises Total Joint Exercises Marching in Standing: AROM;5 reps;Both   Assessment/Plan    PT Assessment Patient needs continued PT services  PT Problem List Decreased strength;Decreased balance;Decreased knowledge of precautions;Pain;Decreased mobility;Decreased knowledge of use of DME;Decreased activity tolerance;Decreased coordination;Decreased safety awareness       PT Treatment Interventions DME instruction;Functional mobility training;Balance training;Patient/family education;Gait training;Therapeutic activities;Neuromuscular re-education;Therapeutic exercise    PT Goals (Current goals can be found in the Care Plan section)  Acute Rehab PT Goals Patient Stated Goal: to return home, get stronger PT Goal Formulation: With patient/family Time For Goal Achievement: 09/16/18 Potential to Achieve Goals: Fair    Frequency Min 2X/week   Barriers to discharge        Co-evaluation               AM-PAC PT "6  Clicks" Mobility  Outcome Measure Help needed turning from your back to your side while in a flat bed without using bedrails?: A Little Help needed moving from lying on your back to sitting on the side of a flat bed without using bedrails?: A Little Help needed moving to and from a bed to a chair (including a wheelchair)?: A Little Help needed standing up from a chair using your arms (e.g., wheelchair or bedside chair)?: A Little Help needed to walk in hospital room?: A Lot Help needed climbing 3-5 steps with a railing? : A Lot 6 Click Score: 16    End of Session Equipment Utilized During Treatment: Gait belt;Cervical collar Activity Tolerance: Patient limited by fatigue;Other (comment)(fearful of falling) Patient left: in chair;with chair alarm set;with family/visitor present;with call bell/phone within reach Nurse Communication: Mobility status PT Visit Diagnosis: Other abnormalities of gait and mobility (R26.89);Unsteadiness on feet (R26.81);Muscle weakness (generalized) (M62.81);History of falling (Z91.81)    Time: 0940-1000 PT Time Calculation (min) (ACUTE ONLY): 20 min   Charges:   PT Evaluation $PT Eval Moderate Complexity: 1 Mod PT Treatments $Therapeutic Activity: 8-22 mins        Shamel Galyean, PT, GCS 09/09/18,10:29 AM

## 2018-09-09 NOTE — Progress Notes (Signed)
Patient discharged home with family on home health. Discharge instruction given to patient/wife. Patient went home with with his urethral foley, education given to patient's wife who demonstrates understanding by teaching back. All questions answered and concerns addressed.

## 2018-11-01 IMAGING — CR DG CHEST 2V
2 series · 2 of 2 positions shown · non-contrast
Comparison: Chest radiograph October 14, 2017 and chest CT October 15, 2017

CLINICAL DATA: Shortness of breath.

EXAM:
CHEST - 2 VIEW

[chest lat]
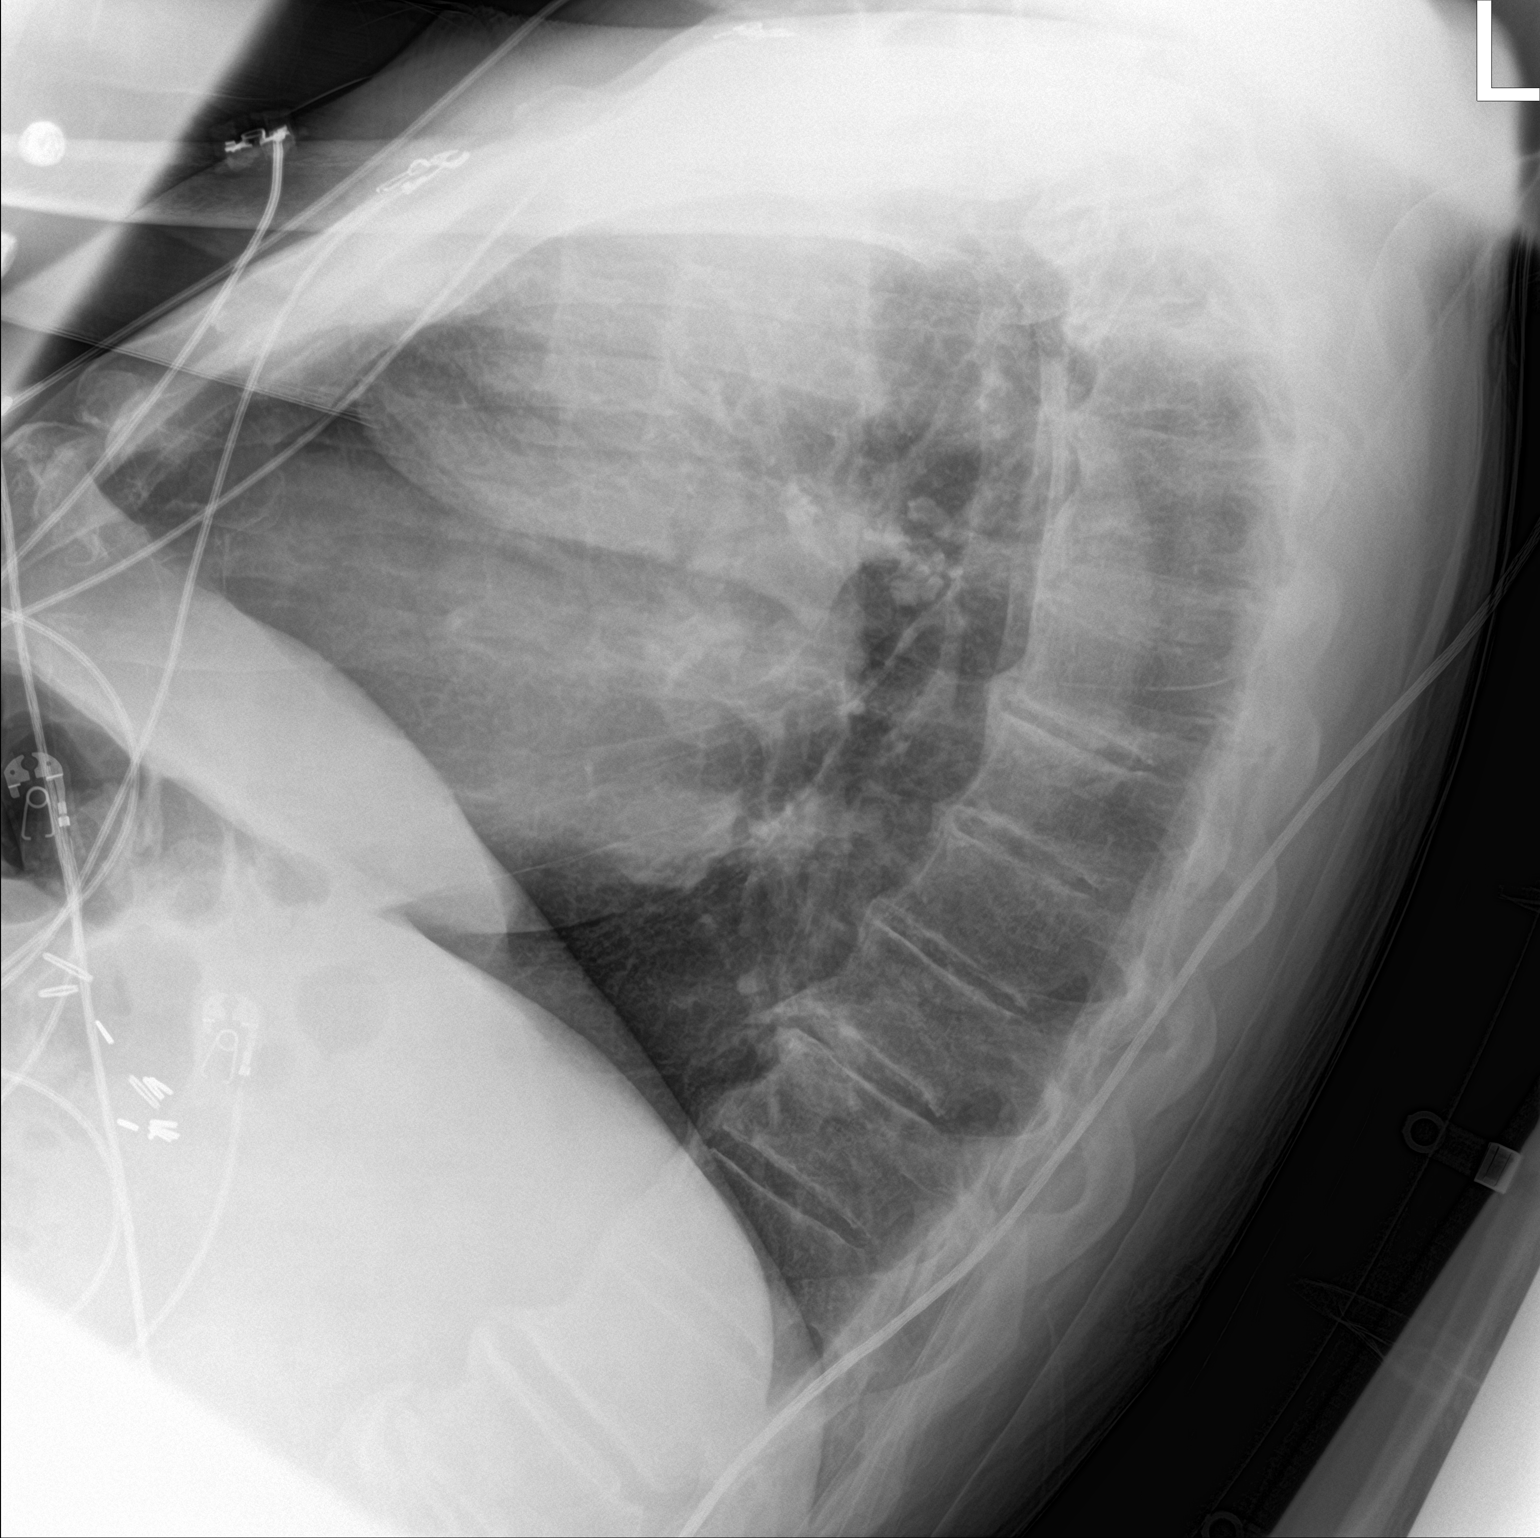

[chest ap]
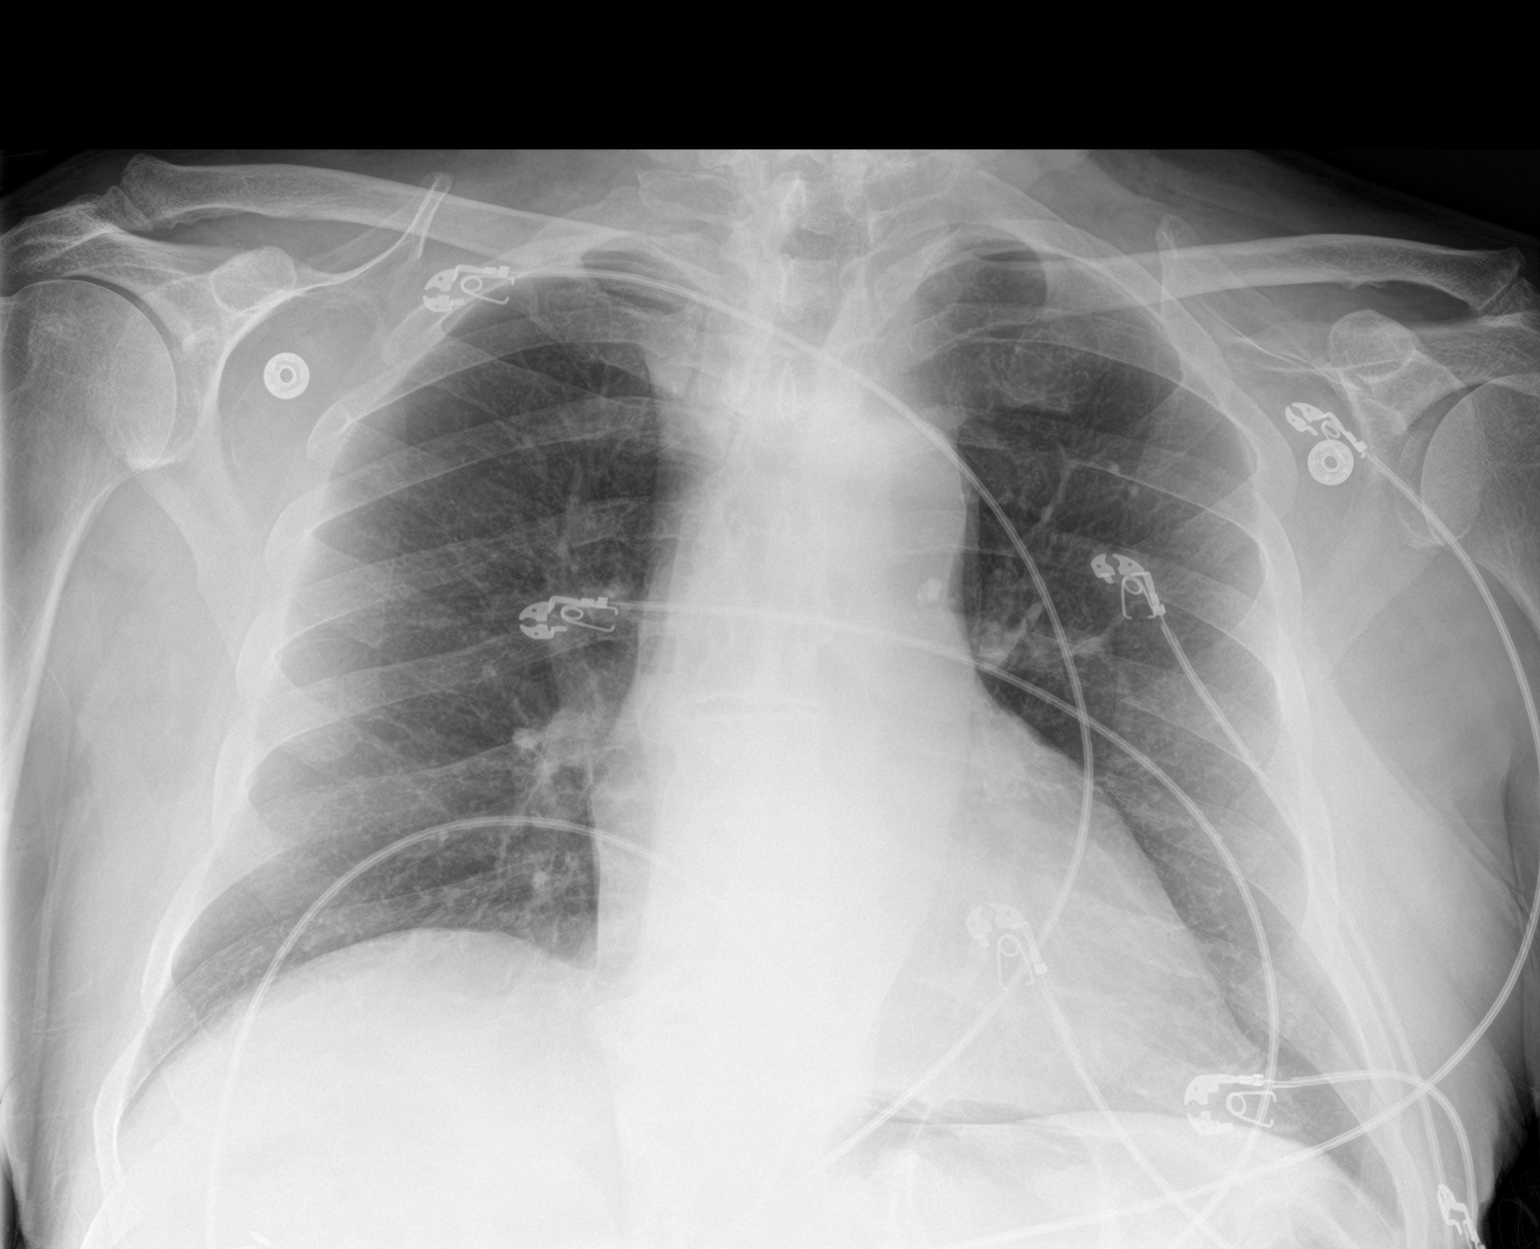

[2 of 2 positions shown; findings below may reference images not displayed]

FINDINGS: There is no edema or consolidation. Heart is upper normal in size
with pulmonary vascularity within normal limits. There is a small
calcified aortopulmonary window lymph node. No adenopathy evident.
There is mild degenerative change in the thoracic spine.
IMPRESSION: Evidence of prior granulomatous disease.  No edema or consolidation.

## 2018-11-01 IMAGING — CT CT ABD-PELV W/ CM
2 of 6 series · 16 of 46 positions shown, 18 images · IV contrast (APPLIED)
Comparison: 08/06/2017

CLINICAL DATA: Status post fall with pelvic pain.

EXAM:
CT ABDOMEN AND PELVIS WITH CONTRAST
TECHNIQUE: Multidetector CT imaging of the abdomen and pelvis was performed
using the standard protocol following bolus administration of
intravenous contrast.
CONTRAST:  100mL OMNIPAQUE IOHEXOL 300 MG/ML  SOLN

[Series 2: abd/ pelvis 5.0 i30f 2 · axial · 0.87mm/px · z∈[+632,+1037]mm · 13 of 93 slices shown, 15 images]
[im 6/93  soft-tissue]
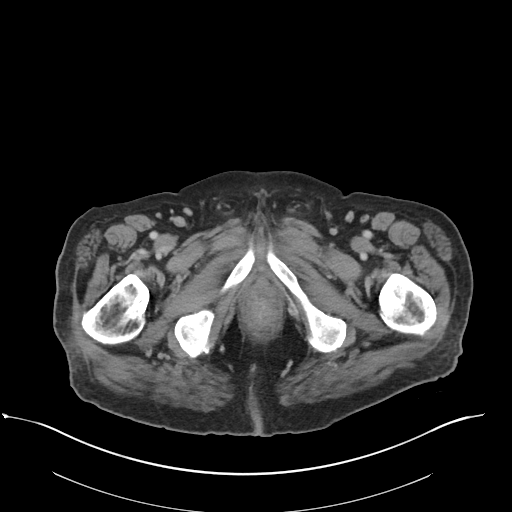
[im 6/93  bone]
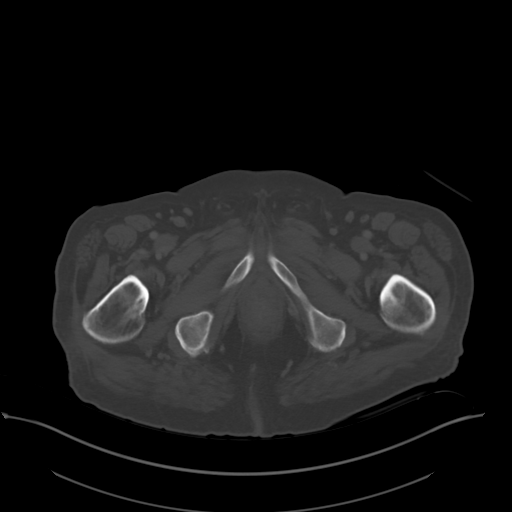
[im 11/93  soft-tissue]
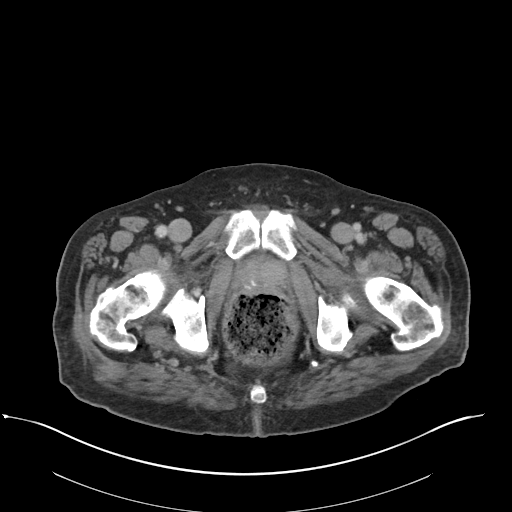
[im 22/93  soft-tissue]
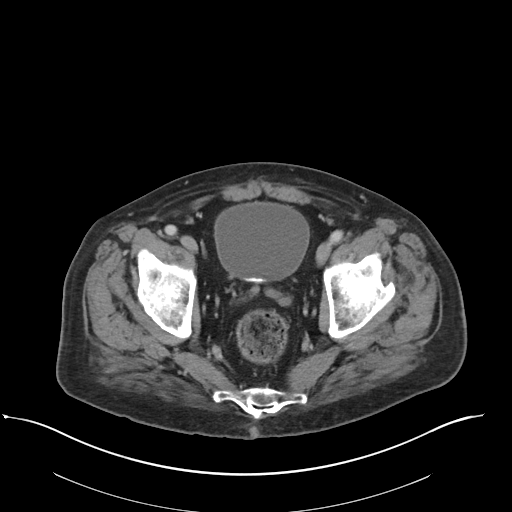
[im 28/93  soft-tissue]
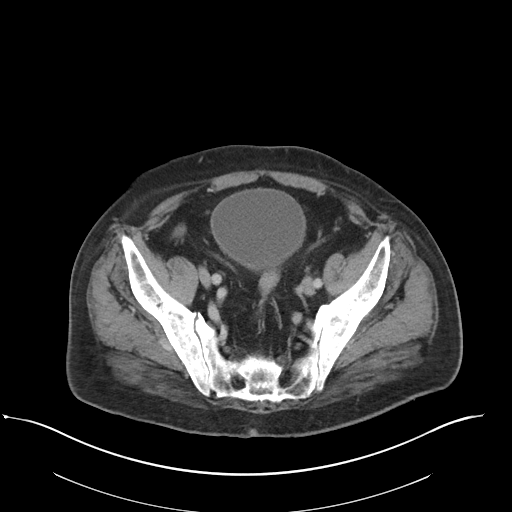
[im 33/93  soft-tissue]
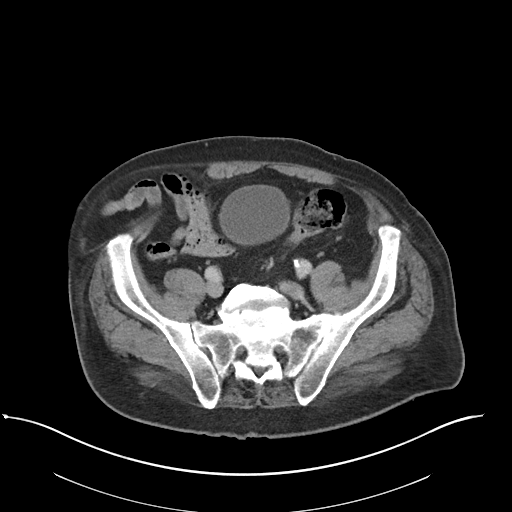
[im 38/93  soft-tissue]
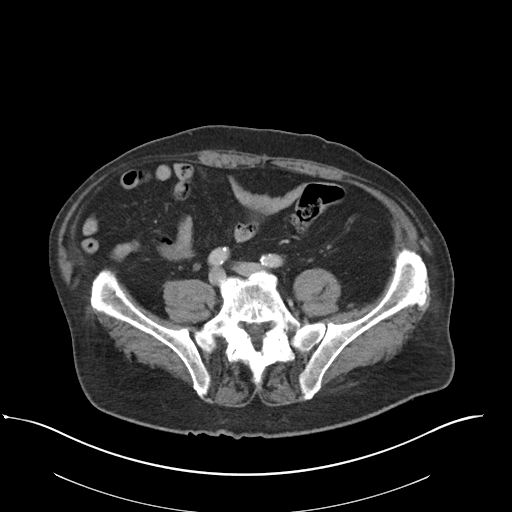
[im 49/93  soft-tissue]
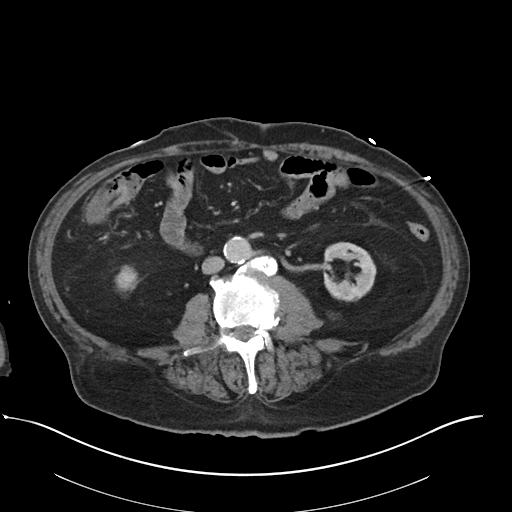
[im 55/93  soft-tissue]
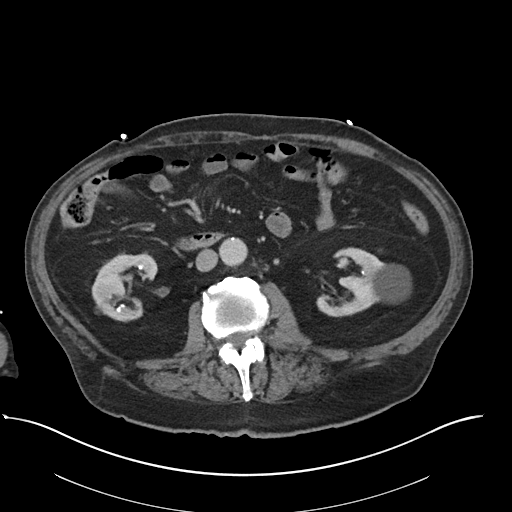
[im 60/93  soft-tissue]
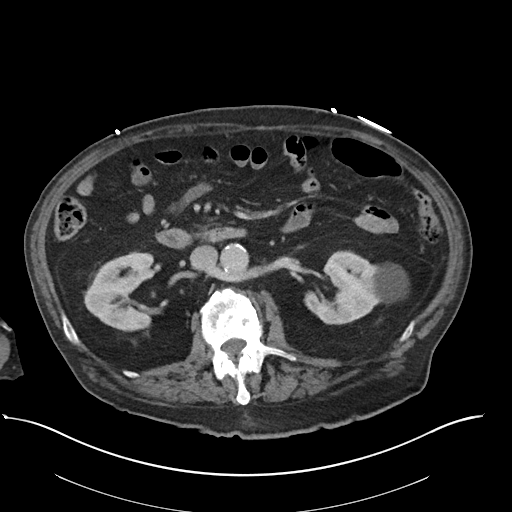
[im 60/93  bone]
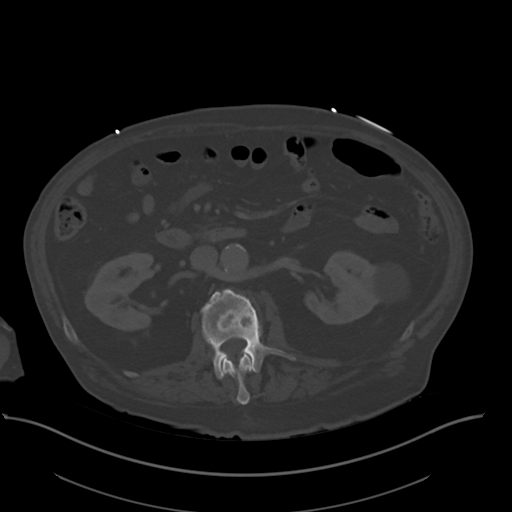
[im 65/93  soft-tissue]
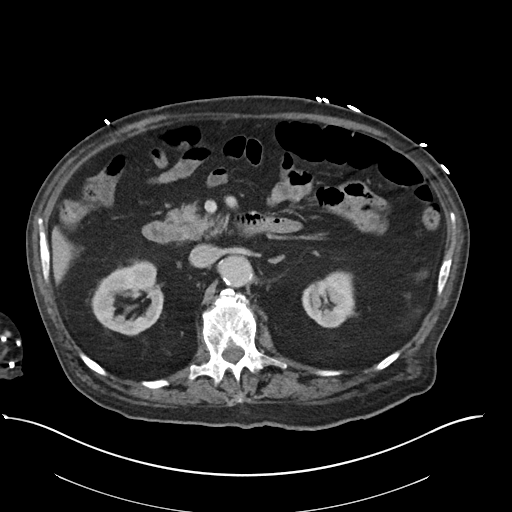
[im 71/93  soft-tissue]
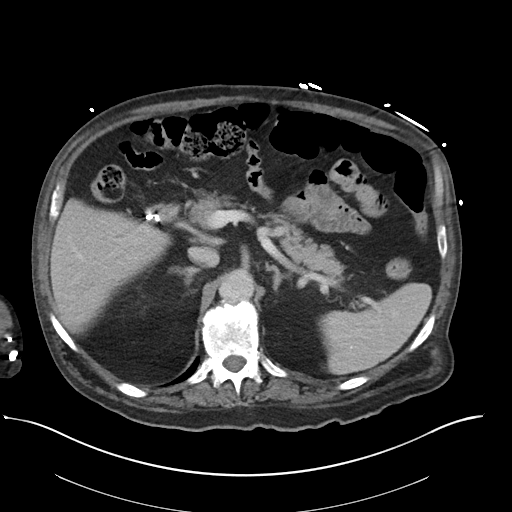
[im 82/93  soft-tissue]
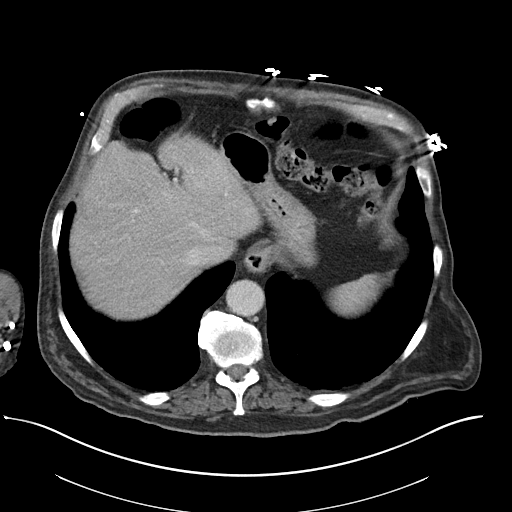
[im 87/93  soft-tissue]
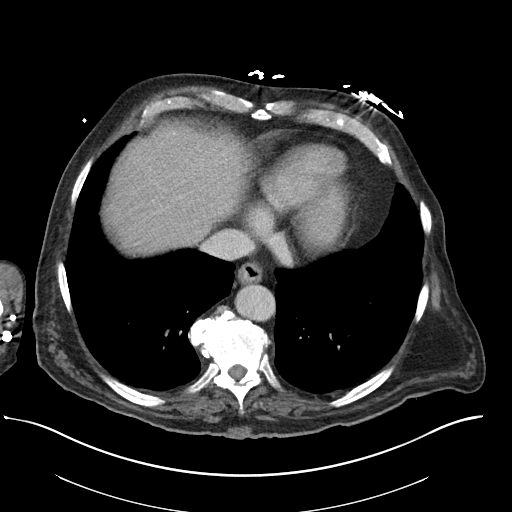

[Series 5: coronal soft tissue · coronal · 0.81mm/px · 3 of 102 slices shown]
[im 34/102  soft-tissue]
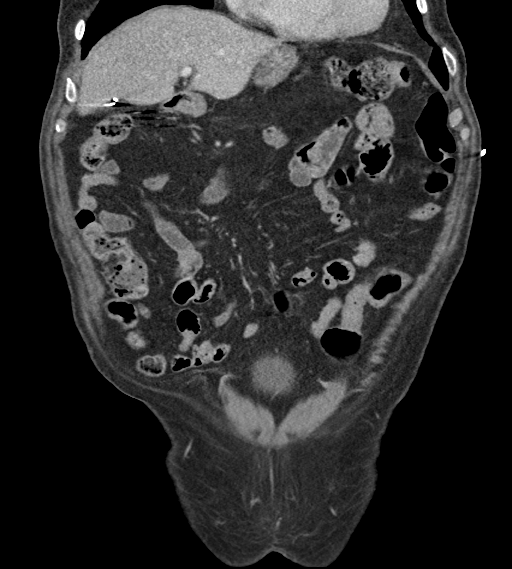
[im 45/102  soft-tissue]
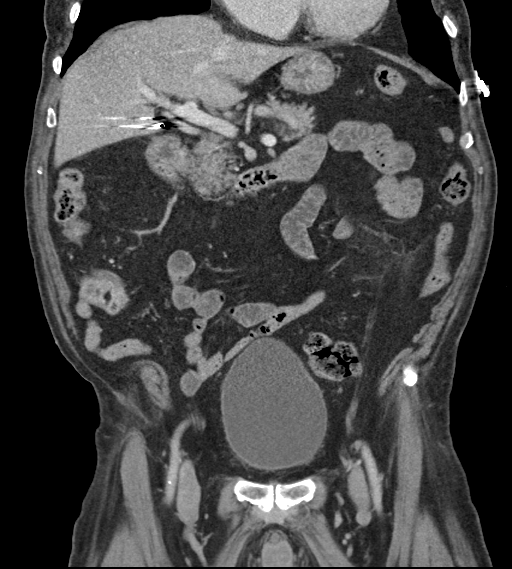
[im 57/102  soft-tissue]
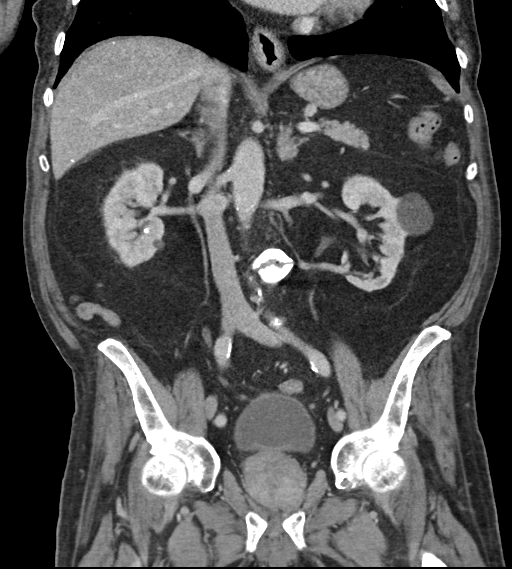

[16 of 46 positions shown; findings below may reference images not displayed]

FINDINGS: Lower chest: No acute abnormality.

Hepatobiliary: No focal liver abnormality is seen. Status post
cholecystectomy. No biliary dilatation.

Pancreas: Unremarkable. No pancreatic ductal dilatation or
surrounding inflammatory changes.

Spleen: Scattered splenic granulomata.

Adrenals/Urinary Tract: Adrenal glands are unremarkable. Kidneys are
without solid renal masses, or hydronephrosis. Bilateral renal
cysts, the largest exophytic cyst off of the mid polar region of the
left kidney measuring 4.4 cm. Scattered punctate nonobstructive
renal calculi bilaterally, the largest in the lower pole of the
right kidney measuring 4 mm. Layering calculi within the dependent
portion of the bladder.

Stomach/Bowel: Stomach is within normal limits. Appendix appears
normal. No evidence of bowel wall thickening, distention, or
inflammatory changes.

Vascular/Lymphatic: Aortic atherosclerosis. No enlarged abdominal or
pelvic lymph nodes.

Reproductive: Enlarged heterogeneous prostate gland containing
coarse calcifications, measures up to 5.9 cm.

Other: No abdominal wall hernia or abnormality. No abdominopelvic
ascites.

Musculoskeletal: Healed bilateral L5 pars articularis defects.
Multilevel osteoarthritic changes of the lumbosacral spine.
IMPRESSION: No evidence of pelvic or hip fracture.

Bilateral nonobstructive nephrolithiasis.

Bilateral renal cysts.

Enlarged heterogeneous prostate gland. Differential diagnosis
includes benign prostatic hypertrophy versus prostate cancer. Please
correlate to serum PSA values.

## 2019-07-19 ENCOUNTER — Emergency Department (HOSPITAL_COMMUNITY): Payer: Medicare PPO

## 2019-07-19 ENCOUNTER — Inpatient Hospital Stay (HOSPITAL_COMMUNITY)
Admission: EM | Admit: 2019-07-19 | Discharge: 2019-07-24 | DRG: 698 | Disposition: E | Payer: Medicare PPO | Attending: Internal Medicine | Admitting: Internal Medicine

## 2019-07-19 ENCOUNTER — Encounter (HOSPITAL_COMMUNITY): Payer: Self-pay

## 2019-07-19 ENCOUNTER — Other Ambulatory Visit: Payer: Self-pay

## 2019-07-19 DIAGNOSIS — N179 Acute kidney failure, unspecified: Secondary | ICD-10-CM | POA: Diagnosis present

## 2019-07-19 DIAGNOSIS — G9341 Metabolic encephalopathy: Secondary | ICD-10-CM | POA: Diagnosis present

## 2019-07-19 DIAGNOSIS — I428 Other cardiomyopathies: Secondary | ICD-10-CM | POA: Diagnosis present

## 2019-07-19 DIAGNOSIS — A4151 Sepsis due to Escherichia coli [E. coli]: Secondary | ICD-10-CM | POA: Diagnosis present

## 2019-07-19 DIAGNOSIS — Z87891 Personal history of nicotine dependence: Secondary | ICD-10-CM

## 2019-07-19 DIAGNOSIS — I472 Ventricular tachycardia: Secondary | ICD-10-CM | POA: Diagnosis not present

## 2019-07-19 DIAGNOSIS — I4891 Unspecified atrial fibrillation: Secondary | ICD-10-CM | POA: Diagnosis present

## 2019-07-19 DIAGNOSIS — L89891 Pressure ulcer of other site, stage 1: Secondary | ICD-10-CM | POA: Diagnosis present

## 2019-07-19 DIAGNOSIS — R627 Adult failure to thrive: Secondary | ICD-10-CM | POA: Diagnosis present

## 2019-07-19 DIAGNOSIS — N182 Chronic kidney disease, stage 2 (mild): Secondary | ICD-10-CM | POA: Diagnosis present

## 2019-07-19 DIAGNOSIS — Z800 Family history of malignant neoplasm of digestive organs: Secondary | ICD-10-CM

## 2019-07-19 DIAGNOSIS — Z6823 Body mass index (BMI) 23.0-23.9, adult: Secondary | ICD-10-CM | POA: Diagnosis not present

## 2019-07-19 DIAGNOSIS — J810 Acute pulmonary edema: Secondary | ICD-10-CM | POA: Diagnosis not present

## 2019-07-19 DIAGNOSIS — J90 Pleural effusion, not elsewhere classified: Secondary | ICD-10-CM | POA: Diagnosis not present

## 2019-07-19 DIAGNOSIS — Z9049 Acquired absence of other specified parts of digestive tract: Secondary | ICD-10-CM

## 2019-07-19 DIAGNOSIS — R17 Unspecified jaundice: Secondary | ICD-10-CM | POA: Diagnosis present

## 2019-07-19 DIAGNOSIS — T83511A Infection and inflammatory reaction due to indwelling urethral catheter, initial encounter: Secondary | ICD-10-CM | POA: Diagnosis present

## 2019-07-19 DIAGNOSIS — Z888 Allergy status to other drugs, medicaments and biological substances status: Secondary | ICD-10-CM

## 2019-07-19 DIAGNOSIS — R64 Cachexia: Secondary | ICD-10-CM | POA: Diagnosis present

## 2019-07-19 DIAGNOSIS — Z818 Family history of other mental and behavioral disorders: Secondary | ICD-10-CM

## 2019-07-19 DIAGNOSIS — Z66 Do not resuscitate: Secondary | ICD-10-CM | POA: Diagnosis not present

## 2019-07-19 DIAGNOSIS — Z20822 Contact with and (suspected) exposure to covid-19: Secondary | ICD-10-CM | POA: Diagnosis present

## 2019-07-19 DIAGNOSIS — E162 Hypoglycemia, unspecified: Secondary | ICD-10-CM | POA: Diagnosis not present

## 2019-07-19 DIAGNOSIS — J9601 Acute respiratory failure with hypoxia: Secondary | ICD-10-CM | POA: Diagnosis present

## 2019-07-19 DIAGNOSIS — F419 Anxiety disorder, unspecified: Secondary | ICD-10-CM | POA: Diagnosis present

## 2019-07-19 DIAGNOSIS — Z87442 Personal history of urinary calculi: Secondary | ICD-10-CM | POA: Diagnosis not present

## 2019-07-19 DIAGNOSIS — N139 Obstructive and reflux uropathy, unspecified: Secondary | ICD-10-CM | POA: Diagnosis present

## 2019-07-19 DIAGNOSIS — I130 Hypertensive heart and chronic kidney disease with heart failure and stage 1 through stage 4 chronic kidney disease, or unspecified chronic kidney disease: Secondary | ICD-10-CM | POA: Diagnosis present

## 2019-07-19 DIAGNOSIS — I482 Chronic atrial fibrillation, unspecified: Secondary | ICD-10-CM | POA: Diagnosis present

## 2019-07-19 DIAGNOSIS — Z7189 Other specified counseling: Secondary | ICD-10-CM | POA: Diagnosis not present

## 2019-07-19 DIAGNOSIS — N3000 Acute cystitis without hematuria: Secondary | ICD-10-CM | POA: Diagnosis present

## 2019-07-19 DIAGNOSIS — E860 Dehydration: Secondary | ICD-10-CM | POA: Diagnosis present

## 2019-07-19 DIAGNOSIS — Z87440 Personal history of urinary (tract) infections: Secondary | ICD-10-CM

## 2019-07-19 DIAGNOSIS — I429 Cardiomyopathy, unspecified: Secondary | ICD-10-CM | POA: Diagnosis not present

## 2019-07-19 DIAGNOSIS — I493 Ventricular premature depolarization: Secondary | ICD-10-CM | POA: Diagnosis present

## 2019-07-19 DIAGNOSIS — I480 Paroxysmal atrial fibrillation: Secondary | ICD-10-CM | POA: Diagnosis not present

## 2019-07-19 DIAGNOSIS — Z515 Encounter for palliative care: Secondary | ICD-10-CM | POA: Diagnosis not present

## 2019-07-19 DIAGNOSIS — I361 Nonrheumatic tricuspid (valve) insufficiency: Secondary | ICD-10-CM | POA: Diagnosis not present

## 2019-07-19 DIAGNOSIS — Z79899 Other long term (current) drug therapy: Secondary | ICD-10-CM

## 2019-07-19 DIAGNOSIS — I509 Heart failure, unspecified: Secondary | ICD-10-CM

## 2019-07-19 DIAGNOSIS — Z8042 Family history of malignant neoplasm of prostate: Secondary | ICD-10-CM

## 2019-07-19 DIAGNOSIS — Z8249 Family history of ischemic heart disease and other diseases of the circulatory system: Secondary | ICD-10-CM

## 2019-07-19 DIAGNOSIS — Y846 Urinary catheterization as the cause of abnormal reaction of the patient, or of later complication, without mention of misadventure at the time of the procedure: Secondary | ICD-10-CM | POA: Diagnosis present

## 2019-07-19 DIAGNOSIS — F329 Major depressive disorder, single episode, unspecified: Secondary | ICD-10-CM | POA: Diagnosis present

## 2019-07-19 DIAGNOSIS — I5043 Acute on chronic combined systolic (congestive) and diastolic (congestive) heart failure: Secondary | ICD-10-CM | POA: Diagnosis present

## 2019-07-19 DIAGNOSIS — E43 Unspecified severe protein-calorie malnutrition: Secondary | ICD-10-CM | POA: Diagnosis present

## 2019-07-19 DIAGNOSIS — I071 Rheumatic tricuspid insufficiency: Secondary | ICD-10-CM | POA: Diagnosis present

## 2019-07-19 LAB — PROTIME-INR
INR: 1.4 — ABNORMAL HIGH (ref 0.8–1.2)
Prothrombin Time: 17.3 seconds — ABNORMAL HIGH (ref 11.4–15.2)

## 2019-07-19 LAB — CBC WITH DIFFERENTIAL/PLATELET
Abs Immature Granulocytes: 0.12 10*3/uL — ABNORMAL HIGH (ref 0.00–0.07)
Basophils Absolute: 0 10*3/uL (ref 0.0–0.1)
Basophils Relative: 0 %
Eosinophils Absolute: 0 10*3/uL (ref 0.0–0.5)
Eosinophils Relative: 0 %
HCT: 43.5 % (ref 39.0–52.0)
Hemoglobin: 13 g/dL (ref 13.0–17.0)
Immature Granulocytes: 1 %
Lymphocytes Relative: 8 %
Lymphs Abs: 0.8 10*3/uL (ref 0.7–4.0)
MCH: 29 pg (ref 26.0–34.0)
MCHC: 29.9 g/dL — ABNORMAL LOW (ref 30.0–36.0)
MCV: 97.1 fL (ref 80.0–100.0)
Monocytes Absolute: 0.7 10*3/uL (ref 0.1–1.0)
Monocytes Relative: 7 %
Neutro Abs: 8.1 10*3/uL — ABNORMAL HIGH (ref 1.7–7.7)
Neutrophils Relative %: 84 %
Platelets: 152 10*3/uL (ref 150–400)
RBC: 4.48 MIL/uL (ref 4.22–5.81)
RDW: 16.1 % — ABNORMAL HIGH (ref 11.5–15.5)
WBC: 9.8 10*3/uL (ref 4.0–10.5)
nRBC: 0 % (ref 0.0–0.2)

## 2019-07-19 LAB — URINALYSIS, ROUTINE W REFLEX MICROSCOPIC
Bilirubin Urine: NEGATIVE
Glucose, UA: NEGATIVE mg/dL
Ketones, ur: NEGATIVE mg/dL
Nitrite: NEGATIVE
Protein, ur: 100 mg/dL — AB
Specific Gravity, Urine: 1.017 (ref 1.005–1.030)
WBC, UA: >50 WBC/hpf — ABNORMAL HIGH (ref 0–5)
pH: 5 (ref 5.0–8.0)

## 2019-07-19 LAB — APTT: aPTT: 30 seconds (ref 24–36)

## 2019-07-19 LAB — COMPREHENSIVE METABOLIC PANEL
ALT: 50 U/L — ABNORMAL HIGH (ref 0–44)
AST: 54 U/L — ABNORMAL HIGH (ref 15–41)
Albumin: 2.6 g/dL — ABNORMAL LOW (ref 3.5–5.0)
Alkaline Phosphatase: 59 U/L (ref 38–126)
Anion gap: 10 (ref 5–15)
BUN: 44 mg/dL — ABNORMAL HIGH (ref 8–23)
CO2: 25 mmol/L (ref 22–32)
Calcium: 8.4 mg/dL — ABNORMAL LOW (ref 8.9–10.3)
Chloride: 109 mmol/L (ref 98–111)
Creatinine, Ser: 1.45 mg/dL — ABNORMAL HIGH (ref 0.61–1.24)
GFR calc Af Amer: 52 mL/min — ABNORMAL LOW (ref >60)
GFR calc non Af Amer: 45 mL/min — ABNORMAL LOW (ref >60)
Glucose, Bld: 66 mg/dL — ABNORMAL LOW (ref 70–99)
Potassium: 4 mmol/L (ref 3.5–5.1)
Sodium: 144 mmol/L (ref 135–145)
Total Bilirubin: 2 mg/dL — ABNORMAL HIGH (ref 0.3–1.2)
Total Protein: 5.3 g/dL — ABNORMAL LOW (ref 6.5–8.1)

## 2019-07-19 LAB — LACTIC ACID, PLASMA: Lactic Acid, Venous: 1.5 mmol/L (ref 0.5–1.9)

## 2019-07-19 LAB — RESPIRATORY PANEL BY RT PCR (FLU A&B, COVID)
Influenza A by PCR: NEGATIVE
Influenza B by PCR: NEGATIVE
SARS Coronavirus 2 by RT PCR: NEGATIVE

## 2019-07-19 LAB — AMMONIA: Ammonia: 19 umol/L (ref 9–35)

## 2019-07-19 LAB — BRAIN NATRIURETIC PEPTIDE: B Natriuretic Peptide: 729.9 pg/mL — ABNORMAL HIGH (ref 0.0–100.0)

## 2019-07-19 MED ORDER — DILTIAZEM HCL 100 MG IV SOLR
5.0000 mg/h | INTRAVENOUS | Status: DC
  Filled 2019-07-19: qty 100

## 2019-07-19 MED ORDER — TRAZODONE HCL 50 MG PO TABS: 25.0000 mg | ORAL_TABLET | Freq: Every day | ORAL | Status: DC

## 2019-07-19 MED ORDER — BACID PO TABS
1.0000 | ORAL_TABLET | Freq: Every day | ORAL | Status: DC
  Filled 2019-07-19 (×2): qty 1

## 2019-07-19 MED ORDER — SODIUM CHLORIDE 0.9 % IV SOLN: 250.0000 mL | INTRAVENOUS | Status: DC | PRN

## 2019-07-19 MED ORDER — DEXTROSE-NACL 5-0.9 % IV SOLN: INTRAVENOUS | Status: DC

## 2019-07-19 MED ORDER — LACTATED RINGERS IV BOLUS (SEPSIS)
1000.0000 mL | Freq: Once | INTRAVENOUS | Status: AC
  Administered 2019-07-19: 12:00:00 1000 mL via INTRAVENOUS

## 2019-07-19 MED ORDER — SERTRALINE HCL 50 MG PO TABS
75.0000 mg | ORAL_TABLET | Freq: Every evening | ORAL | Status: DC
  Filled 2019-07-19: qty 1

## 2019-07-19 MED ORDER — SODIUM CHLORIDE 0.9 % IV SOLN
2.0000 g | INTRAVENOUS | Status: DC
  Administered 2019-07-20: 13:00:00 2 g via INTRAVENOUS
  Filled 2019-07-19: qty 2

## 2019-07-19 MED ORDER — HEPARIN SODIUM (PORCINE) 5000 UNIT/ML IJ SOLN
5000.0000 [IU] | Freq: Three times a day (TID) | INTRAMUSCULAR | Status: DC
  Administered 2019-07-19 – 2019-07-20 (×3): 5000 [IU] via SUBCUTANEOUS
  Filled 2019-07-19 (×2): qty 1

## 2019-07-19 MED ORDER — TETRAHYDROZOLINE HCL 0.05 % OP SOLN: 1.0000 [drp] | Freq: Four times a day (QID) | OPHTHALMIC | Status: DC

## 2019-07-19 MED ORDER — VORTIOXETINE HBR 20 MG PO TABS
20.0000 mg | ORAL_TABLET | Freq: Every day | ORAL | Status: DC
  Filled 2019-07-19 (×2): qty 1

## 2019-07-19 MED ORDER — SODIUM CHLORIDE 0.9% FLUSH: 3.0000 mL | INTRAVENOUS | Status: DC | PRN

## 2019-07-19 MED ORDER — SODIUM CHLORIDE 0.9 % IV SOLN
2.0000 g | Freq: Once | INTRAVENOUS | Status: AC
  Administered 2019-07-19: 12:00:00 2 g via INTRAVENOUS
  Filled 2019-07-19: qty 2

## 2019-07-19 MED ORDER — ONDANSETRON HCL 4 MG/2ML IJ SOLN: 4.0000 mg | Freq: Four times a day (QID) | INTRAMUSCULAR | Status: DC | PRN

## 2019-07-19 MED ORDER — DEXTROSE 10 % IV BOLUS
250.0000 mL | Freq: Once | INTRAVENOUS | Status: AC
  Administered 2019-07-19: 250 mL via INTRAVENOUS

## 2019-07-19 MED ORDER — ACETAMINOPHEN 325 MG PO TABS: 650.0000 mg | ORAL_TABLET | ORAL | Status: DC | PRN

## 2019-07-19 MED ORDER — DILTIAZEM HCL-DEXTROSE 125-5 MG/125ML-% IV SOLN (PREMIX)
5.0000 mg/h | INTRAVENOUS | Status: DC
  Administered 2019-07-19: 5 mg/h via INTRAVENOUS
  Administered 2019-07-19: 20:00:00 7.5 mg/h via INTRAVENOUS
  Administered 2019-07-20: 12:00:00 5 mg/h via INTRAVENOUS
  Filled 2019-07-19 (×3): qty 125

## 2019-07-19 MED ORDER — SODIUM CHLORIDE 0.9% FLUSH
3.0000 mL | Freq: Two times a day (BID) | INTRAVENOUS | Status: DC
  Administered 2019-07-20 (×2): 3 mL via INTRAVENOUS

## 2019-07-19 MED ORDER — DIGOXIN 0.25 MG/ML IJ SOLN
0.5000 mg | Freq: Once | INTRAMUSCULAR | Status: AC
  Administered 2019-07-19: 17:00:00 0.5 mg via INTRAVENOUS
  Filled 2019-07-19: qty 2

## 2019-07-19 MED ORDER — ACETAMINOPHEN 500 MG PO TABS: 500.0000 mg | ORAL_TABLET | Freq: Four times a day (QID) | ORAL | Status: DC | PRN

## 2019-07-19 NOTE — ED Triage Notes (Signed)
Per [facility] pt's spouse called [phone] for weakness and refusing to eat x2-3 days. Pt has indwelling catheter since 08/2017, changed monthly by [facility] RN, last changed 06/21/2019.   16g RAC 1500 cc NS   104/60 130 irregular 94% 2L [place]  98.1  12-16 RR

## 2019-07-19 NOTE — ED Notes (Signed)
Pt w/stage 1 breakdown to penis

## 2019-07-19 NOTE — Progress Notes (Signed)
Pharmacy Antibiotic Note  Gary Bean is a 85 y.o. male admitted on 25-Jul-2019 with UTI.  Pharmacy has been consulted for cefepime dosing. Pt is afebrile and WBC is WNL. Scr is slightly elevated at 1.45.   Plan: Cefepime 2gm IV Q24H F/u renal fxn, C&S, clinical status   Height: 5\' 7"  (170.2 cm) Weight: 143 lb (64.9 kg) IBW/kg (Calculated) : 66.1  Temp (24hrs), Avg:97.6 F (36.4 C), Min:97.6 F (36.4 C), Max:97.6 F (36.4 C)  No results for input(s): WBC, CREATININE, LATICACIDVEN, VANCOTROUGH, VANCOPEAK, VANCORANDOM, GENTTROUGH, GENTPEAK, GENTRANDOM, TOBRATROUGH, TOBRAPEAK, TOBRARND, AMIKACINPEAK, AMIKACINTROU, AMIKACIN in the last 168 hours.  CrCl cannot be calculated (Patient's most recent lab result is older than the maximum 21 days allowed.).    No Known Allergies  Antimicrobials this admission: Cefepime 1/27>>  Dose adjustments this admission: N/A  Microbiology results: Pending  Thank you for allowing pharmacy to be a part of this patient's care.  Gary Bean, Jul 25, 2019 11:25 AM

## 2019-07-19 NOTE — ED Provider Notes (Signed)
[facility] EMERGENCY DEPARTMENT Provider Note   CSN: [id number] Arrival date & time: July 28, 2019  1051     History Chief Complaint  Patient presents with  . Weakness  . Hypotension    Gary Bean is a 85 y.o. male.  HPI   Patient presents to the emergency room for evaluation of weakness and lethargy.  Patient has a history of atrial fibrillation, protein calorie malnutrition, as well as other medical problems.  According to the EMS report the patient has been weak and not eating for the last few days.  He has a chronic indwelling Foley catheter.  It has not been changed for a month.  No known fever or vomiting.  Patient is unable to provide any history himself.  Past Medical History:  Diagnosis Date  . Anxiety   . Atrial fibrillation (HCC)   . Depression   . Dizzy 07/27/2017  . Hypertension   . Nephrolithiasis     Patient Active Problem List   Diagnosis Date Noted  . Protein-calorie malnutrition, severe 09/08/2018  . Acute renal failure (HCC) 09/07/2018  . BPH (benign prostatic hyperplasia) 09/07/2018  . Dehydration 07/26/2017  . Postural dizziness with presyncope 07/26/2017  . Hypotension 07/26/2017  . Anxiety and depression 07/26/2017  . Hypokalemia 07/26/2017  . AKI (acute kidney injury) (HCC) 07/26/2017  . Atrial fibrillation, unspecified   . Encounter for cardioversion procedure   . Atrial fibrillation (HCC) 11/16/2014  . Nonischemic cardiomyopathy (HCC) 11/16/2014    Past Surgical History:  Procedure Laterality Date  . CARDIOVERSION N/A 11/30/2014   Procedure: CARDIOVERSION;  Surgeon: Chrystie Nose, MD;  Location: [facility] ENDOSCOPY;  Service: Cardiovascular;  Laterality: N/A;  09:19 elective cardioversion with propofol anesthesia,  @ 150 joules from Afib to SR with occassional PVC  . CHOLECYSTECTOMY    . KIDNEY STONE SURGERY         Family History  Problem Relation Age of Onset  . CAD Father 20       died  . Goiter Mother   .  Prostate cancer Brother   . Rectal cancer Sister 61  . Depression Sister     Social History   Tobacco Use  . Smoking status: Former Smoker    Packs/day: 0.50    Years: 20.00    Pack years: 10.00    Types: Cigarettes  . Smokeless tobacco: Never Used  . Tobacco comment: Quit 2003  Substance Use Topics  . Alcohol use: Yes    Alcohol/week: 0.0 standard drinks    Comment: SOCIAL  . Drug use: No    Home Medications Prior to Admission medications   Medication Sig Start Date End Date Taking? Authorizing Provider  acetaminophen (TYLENOL) 500 MG tablet Take 500 mg by mouth every 6 (six) hours as needed for mild pain, moderate pain or headache.     [provider]  diltiazem (CARDIZEM CD) 120 MG 24 hr capsule Take 120 mg by mouth daily. 07/30/18   [provider]  diltiazem (CARDIZEM CD) 240 MG 24 hr capsule TAKE 1 CAPSULE (240 MG TOTAL) BY MOUTH DAILY. Patient not taking: Reported on 09/07/2018 02/02/18   Rollene Rotunda, MD  ELIQUIS 5 MG TABS tablet TAKE 1 TABLET BY MOUTH TWICE A DAY Patient not taking: Reported on 09/07/2018 04/22/18   Rollene Rotunda, MD  Probiotic Product (ALIGN PO) Take 1 tablet by mouth daily.     [provider]  sertraline (ZOLOFT) 50 MG tablet Take 1.5 tablets (75 mg total)  by mouth daily. Patient taking differently: Take 75 mg by mouth every evening. 1500 11/10/17   Arfeen, Phillips Grout, MD  Tetrahydrozoline HCl (VISINE OP) Place 1 drop into both eyes daily as needed (dry eyes).    [provider]  traZODone (DESYREL) 50 MG tablet Take 0.5 tablets (25 mg total) by mouth at bedtime. Patient not taking: Reported on 09/07/2018 11/30/17   Arfeen, Phillips Grout, MD  TRINTELLIX 20 MG TABS tablet Take 20 mg by mouth at bedtime. 08/20/18   [provider]    Allergies    Patient has no known allergies.  Review of Systems   Review of Systems  Unable to perform ROS: Mental status change    Physical Exam Updated Vital Signs BP 128/88    Pulse (!) 58   Temp 97.6 F (36.4 C) (Oral)   Resp 14   Ht 1.702 m (5\' 7" )   Wt 64.9 kg   SpO2 100%   BMI 22.40 kg/m   Physical Exam Vitals and nursing note reviewed.  Constitutional:      Appearance: He is well-developed. He is ill-appearing.  HENT:     Head: Normocephalic and atraumatic.     Right Ear: External ear normal.     Left Ear: External ear normal.     Mouth/Throat:     Comments: MM dry Eyes:     General: No scleral icterus.       Right eye: No discharge.        Left eye: No discharge.     Conjunctiva/sclera: Conjunctivae normal.  Neck:     Trachea: No tracheal deviation.  Cardiovascular:     Rate and Rhythm: Tachycardia present. Rhythm irregularly irregular.  Pulmonary:     Effort: Pulmonary effort is normal. No respiratory distress.     Breath sounds: Normal breath sounds. No stridor. No wheezing or rales.  Abdominal:     General: Bowel sounds are normal. There is no distension.     Palpations: Abdomen is soft.     Tenderness: There is no abdominal tenderness. There is no guarding or rebound.  Musculoskeletal:        General: No tenderness.     Cervical back: Neck supple.  Skin:    General: Skin is warm and dry.     Findings: No rash.  Neurological:     Mental Status: He is lethargic.     Cranial Nerves: Cranial nerve deficit:       Motor: Weakness present. No abnormal muscle tone or seizure activity.     Comments: Pt does not answer questions appropriately, will nod yes when asked to do something but then does not follow the command, does not move any extremity when asked     ED Results / Procedures / Treatments   Labs (all labs ordered are listed, but only abnormal results are displayed) Labs Reviewed  COMPREHENSIVE METABOLIC PANEL - Abnormal; Notable for the following components:      Result Value   Glucose, Bld 66 (*)    BUN 44 (*)    Creatinine, Ser 1.45 (*)    Calcium 8.4 (*)    Total Protein 5.3 (*)    Albumin 2.6 (*)    AST 54 (*)     ALT 50 (*)    Total Bilirubin 2.0 (*)    GFR calc non Af Amer 45 (*)    GFR calc Af Amer 52 (*)    All other components within normal limits  CBC  WITH DIFFERENTIAL/PLATELET - Abnormal; Notable for the following components:   MCHC 29.9 (*)    RDW 16.1 (*)    Neutro Abs 8.1 (*)    Abs Immature Granulocytes 0.12 (*)    All other components within normal limits  PROTIME-INR - Abnormal; Notable for the following components:   Prothrombin Time 17.3 (*)    INR 1.4 (*)    All other components within normal limits  URINALYSIS, ROUTINE W REFLEX MICROSCOPIC - Abnormal; Notable for the following components:   Color, Urine AMBER (*)    APPearance HAZY (*)    Hgb urine dipstick MODERATE (*)    Protein, ur 100 (*)    Leukocytes,Ua LARGE (*)    WBC, UA >50 (*)    Bacteria, UA MANY (*)    All other components within normal limits  CULTURE, BLOOD (ROUTINE X 2)  CULTURE, BLOOD (ROUTINE X 2)  URINE CULTURE  RESPIRATORY PANEL BY RT PCR (FLU A&B, COVID)  LACTIC ACID, PLASMA  APTT  LACTIC ACID, PLASMA  AMMONIA  BRAIN NATRIURETIC PEPTIDE    EKG EKG Interpretation  Date/Time:  Wednesday July 19 2019 11:00:22 EST Ventricular Rate:  152 PR Interval:    QRS Duration: 90 QT Interval:  305 QTC Calculation: 485 R Axis:   67 Text Interpretation: atrial fibrillation Low voltage, extremity leads Repolarization abnormality, prob rate related Since last tracing rate faster Confirmed by Linwood Dibbles [phone]) on 06/29/2019 11:07:10 AM   Radiology CT Head Wo Contrast  Result Date: 06/26/2019 CLINICAL DATA:  Weakness.  Refusing to eat. EXAM: CT HEAD WITHOUT CONTRAST TECHNIQUE: Contiguous axial images were obtained from the base of the skull through the vertex without intravenous contrast. COMPARISON:  11/30/2018 FINDINGS: Brain: No evidence of acute infarction, hemorrhage, hydrocephalus, extra-axial collection or mass lesion/mass effect. Minor periventricular white matter hypoattenuation is noted  consistent with chronic microvascular ischemic change. Vascular: No hyperdense vessel or unexpected calcification. Skull: Normal. Negative for fracture or focal lesion. Sinuses/Orbits: Globes and orbits are unremarkable. Sinuses are clear. Other: None. IMPRESSION: 1. No acute intracranial abnormalities. 2. Minor chronic microvascular ischemic change. Stable appearance from the prior head CT. Electronically Signed   By: Amie Portland M.D.   On: 06/29/2019 13:02   DG Chest Portable 1 View  Result Date: 07/17/2019 CLINICAL DATA:  Sepsis EXAM: PORTABLE CHEST 1 VIEW COMPARISON:  09/07/2018 FINDINGS: Cardiac enlargement. Congestive heart failure with pulmonary edema and moderate pleural effusions. Bibasilar atelectasis. Atherosclerotic aorta. IMPRESSION: Congestive heart failure with pulmonary edema and moderate pleural effusions. Bibasilar compressive atelectasis. Electronically Signed   By: Marlan Palau M.D.   On: 06/28/2019 11:55    Procedures .Critical Care Performed by: Linwood Dibbles, MD Authorized by: Linwood Dibbles, MD   Critical care provider statement:    Critical care time (minutes):  45   Critical care was time spent personally by me on the following activities:  Discussions with consultants, evaluation of patient's response to treatment, examination of patient, ordering and performing treatments and interventions, ordering and review of laboratory studies, ordering and review of radiographic studies, pulse oximetry, re-evaluation of patient's condition, obtaining history from patient or surrogate and review of old charts   (including critical care time)  Medications Ordered in ED Medications  ceFEPIme (MAXIPIME) 2 g in sodium chloride 0.9 % 100 mL IVPB (has no administration in time range)  dextrose (D10W) 10% bolus 250 mL (has no administration in time range)  lactated ringers bolus 1,000 mL (0 mLs Intravenous Stopped 06/24/2019 1328)  And  lactated ringers bolus 1,000 mL (0 mLs Intravenous  Stopped 07-31-19 1328)  ceFEPIme (MAXIPIME) 2 g in sodium chloride 0.9 % 100 mL IVPB (0 g Intravenous Stopped 07-31-19 1328)    ED Course  I have reviewed the triage vital signs and the nursing notes.  Pertinent labs & imaging results that were available during my care of the patient were reviewed by me and considered in my medical decision making (see chart for details).  Clinical Course as of Jul 18 1499  Wed 07/31/2019 D/w wife,  Pt had bladder infection end of December.  Over last several weeks, not wanting to eat or drink much.  No fever.  Last few days has been confused.  Monday slept all day.  Yesterday when wife got him to eat or drink he said he couldn't.  Normally he is able to have a conversation but has not made sense in the last couple of days.  Normally he can walk around but has not in the last week.   [VC]  9449 Head CT without acute findings   [JK]  1442 Urinalysis does suggest infection although patient does chronically have an indwelling catheter   [JK]  1443 Initial lactic acid level not elevated   [JK]  1443 Creatinine increased compared recently.  Glucose also borderline   [JK]  1443 Bilirubin is elevated   [JK]  1449 Chest x-ray shows evidence of congestive heart failure with pulmonary edema.  Patient's oxygenation is reassuring.  I will add on BNP   [JK]    Clinical Course User Index [JK] Dorie Rank, MD   MDM Rules/Calculators/A&P                      Patient's labs are notable for urinary tract infection.  He also has evidence of acute kidney injury and hyperglycemia with dehydration.  This is likely related to his poor p.o. intake.  Patient does have elevated bilirubin.  I do not have any old for comparison.  Considering his decreased appetite I think an abdominal ultrasound is indicated.   Final Clinical Impression(s) / ED Diagnoses Final diagnoses:  Acute cystitis without hematuria  Dehydration  AKI (acute kidney injury) ([place])  Hypoglycemia    Acute pulmonary edema (HCC)       Dorie Rank, MD Jul 31, 2019 1501

## 2019-07-19 NOTE — H&P (Signed)
History and Physical    Gary Bean ZCH:[id number] DOB: 10-16-1934 DOA: 07/02/2019  PCP: Gaynelle Arabian, MD (Confirm with patient/family/NH records and if not entered, this has to be entered at [facility] point of entry) Patient coming from: Home    Chief Complaint: Weakness  HPI: Gary Bean is a 85 y.o. male with medical history significant of paroxysmal atrial fibrillation s/p DCCV, depression, history of bleeding off DOAC, failure to thrive, hypotension, non-ischemic cardiomyopathy, and chronic indwelling Foley catheter who is being seen today for the evaluation of afib and heart failure. Had a catheter associated UTI in December patient has visual and auditory hallucination, received 5 days of Keflex completed on January 5 (and his Foley was exchanged on December 30).  His hallucination symptoms much improved.  2 days ago patient started to feel weak and sleepy.  He stopped eating and drinking and taking his Cardizem for the last 2 days according to patient's wife.  His wife has been feeding him with soft diet and liquid, and the whole day yesterday she was only able to feed him with few sips of water.  ED Course: Patient was found to be in fast A. fib, and borderline hypotension, patient received 2 L of IV fluid, x-ray showed pulmonary edema.  Work-up showed patient has AKI.  Review of Systems: Unable to perform, patient very lethargic.  Past Medical History:  Diagnosis Date  . Anxiety   . Atrial fibrillation ([place])   . Depression   . Dizzy 07/27/2017  . Hypertension   . Nephrolithiasis     Past Surgical History:  Procedure Laterality Date  . CARDIOVERSION N/A 11/30/2014   Procedure: CARDIOVERSION;  Surgeon: Pixie Casino, MD;  Location: [facility] ENDOSCOPY;  Service: Cardiovascular;  Laterality: N/A;  09:19 elective cardioversion with propofol anesthesia,  @ 150 joules from Afib to SR with occassional PVC  . CHOLECYSTECTOMY    . KIDNEY STONE SURGERY       reports that he has quit  smoking. His smoking use included cigarettes. He has a 10.00 pack-year smoking history. He has never used smokeless tobacco. He reports current alcohol use. He reports that he does not use drugs.  Allergies  Allergen Reactions  . Eliquis [Apixaban] Other (See Comments)    Made the patient's gums bleed profusely  . Xarelto [Rivaroxaban] Other (See Comments)    Made the patient's gums bleed profusely    Family History  Problem Relation Age of Onset  . CAD Father 69       died  . Goiter Mother   . Prostate cancer Brother   . Rectal cancer Sister 23  . Depression Sister      Prior to Admission medications   Medication Sig Start Date End Date Taking? Authorizing Provider  acetaminophen (TYLENOL) 500 MG tablet Take 500 mg by mouth every 6 (six) hours as needed for mild pain or headache.    Yes [provider]  sertraline (ZOLOFT) 50 MG tablet Take 1.5 tablets (75 mg total) by mouth daily. Patient taking differently: Take 75 mg by mouth See admin instructions. Take 75 mg by mouth once a day at 3 PM 11/10/17  Yes Arfeen, Arlyce Harman, MD  TIADYLT ER 120 MG 24 hr capsule Take 120 mg by mouth daily. 04/25/19  Yes [provider]  TRINTELLIX 20 MG TABS tablet Take 20 mg by mouth at bedtime. 08/20/18  Yes [provider]    Physical Exam: Vitals:    1630 06/24/2019 1645 07/18/2019  1700 07/16/2019 1715  BP: 116/81 112/89 (!) 126/97 126/81  Pulse: 65  89   Resp:  15 17 (!) 23  Temp:      TempSrc:      SpO2: 100%  (!) 86%   Weight:      Height:        Constitutional: NAD, calm, comfortable Vitals:   07/04/2019 1630 07/08/2019 1645 07/11/2019 1700 06/30/2019 1715  BP: 116/81 112/89 (!) 126/97 126/81  Pulse: 65  89   Resp:  15 17 (!) 23  Temp:      TempSrc:      SpO2: 100%  (!) 86%   Weight:      Height:       Eyes: PERRL, lids and conjunctivae normal ENMT: Mucous membranes are dry. Posterior pharynx clear of any exudate or lesions.Normal dentition.  Neck: normal,  supple, no masses, no thyromegaly Respiratory: Crackles on bilateral bases. Normal respiratory effort. No accessory muscle use.  Cardiovascular: Irregular heartbeat, and no rhythm, no murmurs / rubs / gallops. No extremity edema. 2+ pedal pulses. No carotid bruits.  Abdomen: no tenderness, no masses palpated. No hepatosplenomegaly. Bowel sounds positive.  Musculoskeletal: no clubbing / cyanosis. No joint deformity upper and lower extremities. Good ROM, no contractures. Normal muscle tone.  Skin: no rashes, lesions, ulcers. No induration Neurologic: Lethargic following simple command Psychiatric: Sleepy.   Labs on Admission: I have personally reviewed following labs and imaging studies  CBC: Recent Labs  Lab 07/02/2019 1100  WBC 9.8  NEUTROABS 8.1*  HGB 13.0  HCT 43.5  MCV 97.1  PLT 152   Basic Metabolic Panel: Recent Labs  Lab 06/28/2019 1100  NA 144  K 4.0  CL 109  CO2 25  GLUCOSE 66*  BUN 44*  CREATININE 1.45*  CALCIUM 8.4*   GFR: Estimated Creatinine Clearance: 36.1 mL/min (A) (by C-G formula based on SCr of 1.45 mg/dL (H)). Liver Function Tests: Recent Labs  Lab 07/16/2019 1100  AST 54*  ALT 50*  ALKPHOS 59  BILITOT 2.0*  PROT 5.3*  ALBUMIN 2.6*   No results for input(s): LIPASE, AMYLASE in the last 168 hours. No results for input(s): AMMONIA in the last 168 hours. Coagulation Profile: Recent Labs  Lab 07/11/2019 1100  INR 1.4*   Cardiac Enzymes: No results for input(s): CKTOTAL, CKMB, CKMBINDEX, TROPONINI in the last 168 hours. BNP (last 3 results) No results for input(s): PROBNP in the last 8760 hours. HbA1C: No results for input(s): HGBA1C in the last 72 hours. CBG: No results for input(s): GLUCAP in the last 168 hours. Lipid Profile: No results for input(s): CHOL, HDL, LDLCALC, TRIG, CHOLHDL, LDLDIRECT in the last 72 hours. Thyroid Function Tests: No results for input(s): TSH, T4TOTAL, FREET4, T3FREE, THYROIDAB in the last 72 hours. Anemia Panel:  No results for input(s): VITAMINB12, FOLATE, FERRITIN, TIBC, IRON, RETICCTPCT in the last 72 hours. Urine analysis:    Component Value Date/Time   COLORURINE AMBER (A) 07/08/2019 1200   APPEARANCEUR HAZY (A) 07/10/2019 1200   LABSPEC 1.017 07/02/2019 1200   PHURINE 5.0 07/08/2019 1200   GLUCOSEU NEGATIVE 07/01/2019 1200   HGBUR MODERATE (A) 06/24/2019 1200   BILIRUBINUR NEGATIVE 06/25/2019 1200   KETONESUR NEGATIVE 07/18/2019 1200   PROTEINUR 100 (A) 07/09/2019 1200   NITRITE NEGATIVE 07/22/2019 1200   LEUKOCYTESUR LARGE (A) 07/07/2019 1200    Radiological Exams on Admission: CT Head Wo Contrast  Result Date: 07/03/2019 CLINICAL DATA:  Weakness.  Refusing to eat. EXAM: CT HEAD  WITHOUT CONTRAST TECHNIQUE: Contiguous axial images were obtained from the base of the skull through the vertex without intravenous contrast. COMPARISON:  11/30/2018 FINDINGS: Brain: No evidence of acute infarction, hemorrhage, hydrocephalus, extra-axial collection or mass lesion/mass effect. Minor periventricular white matter hypoattenuation is noted consistent with chronic microvascular ischemic change. Vascular: No hyperdense vessel or unexpected calcification. Skull: Normal. Negative for fracture or focal lesion. Sinuses/Orbits: Globes and orbits are unremarkable. Sinuses are clear. Other: None. IMPRESSION: 1. No acute intracranial abnormalities. 2. Minor chronic microvascular ischemic change. Stable appearance from the prior head CT. Electronically Signed   By: Amie Portland M.D.   On: 2019-08-12 13:02   US Abdomen Complete  Result Date: August 12, 2019 CLINICAL DATA:  Elevated liver function test. EXAM: ABDOMEN ULTRASOUND COMPLETE COMPARISON:  CT, 09/07/2018 FINDINGS: Gallbladder: Status post cholecystectomy Common bile duct: Diameter: 4 mm Liver: No focal lesion identified. Within normal limits in parenchymal echogenicity. Portal vein is patent on color Doppler imaging with normal direction of blood flow towards the  liver. IVC: No abnormality visualized. Pancreas: Visualized portion unremarkable. Spleen: Size and appearance within normal limits. Right Kidney: Length: 10.9 cm. Mild renal cortical thinning. Parenchymal calcification in the midpole. Midpole cyst, 1.2 cm. Second mid upper pole exophytic cyst, 1.6 cm. No hydronephrosis. Left Kidney: Length: 11.0 cm. Mild renal cortical thinning. Prominent exophytic cyst, midpole, 4.8 x 3.9 x 4 cm. Smaller lower pole cyst measuring 2.2 x 2.0 x 2.2 cm. No stones. No hydronephrosis. Abdominal aorta: Diffuse atherosclerotic irregularity.  No aneurysm. Other findings: Small amount of ascites. Bilateral pleural effusions. IMPRESSION: 1. No acute findings.  Normal sonographic appearance of the liver. 2. Small amount of ascites.  Bilateral pleural effusions. 3. Mild bilateral renal cortical thinning. Bilateral renal cysts. No hydronephrosis. 4. Status post cholecystectomy.  No bile duct dilation. Electronically Signed   By: Amie Portland M.D.   On: Aug 12, 2019 15:53   DG Chest Portable 1 View  Result Date: 08/12/2019 CLINICAL DATA:  Sepsis EXAM: PORTABLE CHEST 1 VIEW COMPARISON:  09/07/2018 FINDINGS: Cardiac enlargement. Congestive heart failure with pulmonary edema and moderate pleural effusions. Bibasilar atelectasis. Atherosclerotic aorta. IMPRESSION: Congestive heart failure with pulmonary edema and moderate pleural effusions. Bibasilar compressive atelectasis. Electronically Signed   By: Marlan Palau M.D.   On: 2019/08/12 11:55    EKG: Independently reviewed.  Assessment/Plan Active Problems:   Atrial fibrillation (HCC)   CHF (congestive heart failure) (HCC)  Acute metabolic encephalopathy, likely from recurrent UTI catheter associated, patient has received 5 days of Keflex about 3 weeks ago, clinically suspect resistant organism, continue cefepime.  Foley was exchanged on 30th December.  Chronic A. fib with RVR, was not able to compliant with Cardizem.  Discussed with  cardiologist, recommend Cardizem drip. 1 dose of digoxin given in the ED. monitor volume status, and blood pressure, systolic patient also has systolic heart failure now.  Acute on chronic diastolic CHF, received 2 L of IV fluid in the ED, given patient looks like severe dehydrated, will give slow hydration of D5 normal saline at 50 mL/h for 1 day, sitter start diuresis once blood pressure improves.  AKI on CKD stage II, from dehydration and decompensated CHF, as above.  Bilirubinemia, no significant RUQ tenderness, abdomen ultrasound reassuring.  Stage I pressure ulcer on penis, wound care to follow.  DVT prophylaxis: Heparin subcu Code Status: DNR Family Communication: Patient's wife Disposition Plan: Home versus SNF depends on physical therapy Consults called: Cardiology Admission status: Tele   Emeline General MD [facility]  Pager [phone]  If 7PM-7AM, please contact night-coverage [email or web address] Password [facility]  07/23/2019, 5:47 PM

## 2019-07-19 NOTE — ED Notes (Signed)
Pt transported to CT ?

## 2019-07-19 NOTE — Consult Note (Addendum)
Cardiology Consultation:   Patient ID: REVEL STELLMACH MRN: [id number]; DOB: 1934-10-09  Admit date: 06/29/2019 Date of Consult: 07/01/2019  Primary Care Provider: Gaynelle Arabian, MD Primary Cardiologist: Gary Breeding, MD  Primary Electrophysiologist:  None    Patient Profile:   Gary Bean is a 85 y.o. male with a hx of paroxysmal atrial fibrillation s/p DCCV, depression, history of bleeding on DOAC, failure to thrive, hypotension, non-ischemic cardiomyopathy, and chronic indwelling Foley catheter who is being seen today for the evaluation of afib and heart failure at the request of Dr. Roosevelt Bean.  History of Present Illness:   History was obtained through chart review.   Gary Bean is followed by Gary Bean. He has a history of afib. He had some bleeding on Xarelto in the past and he was switched to Eliquis. Echo in 2016 showed EF 45-50%, mild LVH, diffuse hypokinesis, moderate TR.  In February 2019 the patient was admitted for syncope related to dehydration. In April 2019 he was in the ED for hemoptysis and subsequently taken off the Eliquis for a couple days. When GI work-up was stable Eliquis was restarted. Since then he had a very complicated course. He was seen by Gary Bean on 11/04/2017 and reported weight loss and failure to thrive and no clear etiology was found despite extensive work-up. He had a reported a fall. BP was low and pulse was up.   He presented to the ED 07/06/2019 for weakness and hypotension. Reportedly he had a history of weight loss. EMS reported patient appeared very weak and had not been eating the last few days. The daughter reported that he had a recent UTI and was on abx for that. In the ED B/P 128/88, pulse 58, afebrile, RR 14. Appeared to be lethargic and weak and not answering questions appropriately. Labs showed potassium 4.0, creatinine 1.45, BUN 44. ALT 50 and AST 54. Albumin 2.6. Lactic Acid 1.5. WBC 9.8, Hgb 13.0. PT 17.3. INR 1.4. Blood cultures were  drawn. EKG showed Afib, 152 bpm with nonspecific T wave changes. Head CT without acute findings. CXR was suggestive of CHF with pulmonary edema and moderate pleural effusion. UA was positive for leukocytes. The patient was started on abx and admitted.   On my interview patient is alert but not oriented. He is not verbally responsive to me but is able to deny chest pain at this time.    Heart Pathway Score:     Past Medical History:  Diagnosis Date  . Anxiety   . Atrial fibrillation ([place])   . Depression   . Dizzy 07/27/2017  . Hypertension   . Nephrolithiasis     Past Surgical History:  Procedure Laterality Date  . CARDIOVERSION N/A 11/30/2014   Procedure: CARDIOVERSION;  Surgeon: Gary Casino, MD;  Location: [facility] ENDOSCOPY;  Service: Cardiovascular;  Laterality: N/A;  09:19 elective cardioversion with propofol anesthesia,  @ 150 joules from Afib to SR with occassional PVC  . CHOLECYSTECTOMY    . KIDNEY STONE SURGERY       Home Medications:  Prior to Admission medications   Medication Sig Start Date End Date Taking? Authorizing Provider  acetaminophen (TYLENOL) 500 MG tablet Take 500 mg by mouth every 6 (six) hours as needed for mild pain, moderate pain or headache.    Yes [provider]  sertraline (ZOLOFT) 50 MG tablet Take 1.5 tablets (75 mg total) by mouth daily. Patient taking differently: Take 75 mg by mouth See admin instructions. Take 75 mg by  mouth once a day at 3 PM 11/10/17  Yes Arfeen, Phillips Grout, MD  TIADYLT ER 120 MG 24 hr capsule Take 120 mg by mouth daily. 04/25/19  Yes [provider]  TRINTELLIX 20 MG TABS tablet Take 20 mg by mouth at bedtime. 08/20/18  Yes [provider]  ELIQUIS 5 MG TABS tablet TAKE 1 TABLET BY MOUTH TWICE A DAY Patient not taking: Reported on 08/13/19 04/22/18   Gary Rotunda, MD  Probiotic Product (ALIGN PO) Take 1 tablet by mouth daily.     [provider]  Tetrahydrozoline HCl (VISINE OP) Place 1 drop into  both eyes daily as needed (dry eyes).    [provider]  traZODone (DESYREL) 50 MG tablet Take 0.5 tablets (25 mg total) by mouth at bedtime. Patient not taking: Reported on 09/07/2018 11/30/17   Cleotis Nipper, MD    Inpatient Medications: Scheduled Meds: . heparin  5,000 Units Subcutaneous Q8H  . lactobacillus acidophilus  1 tablet Oral Daily  . sertraline  75 mg Oral QPM  . sodium chloride flush  3 mL Intravenous Q12H  . tetrahydrozoline  1 drop Both Eyes QID  . traZODone  25 mg Oral QHS  . vortioxetine HBr  20 mg Oral QHS   Continuous Infusions: . sodium chloride    . [START ON ] ceFEPime (MAXIPIME) IV     PRN Meds: sodium chloride, acetaminophen, ondansetron (ZOFRAN) IV, sodium chloride flush  Allergies:    Allergies  Allergen Reactions  . Eliquis [Apixaban] Other (See Comments)    Made the patient's gums bleed profusely  . Xarelto [Rivaroxaban] Other (See Comments)    Made the patient's gums bleed profusely    Social History:   Social History   Socioeconomic History  . Marital status: Married    Spouse name: Not on file  . Number of children: 1  . Years of education: Not on file  . Highest education level: Not on file  Occupational History  . Not on file  Tobacco Use  . Smoking status: Former Smoker    Packs/day: 0.50    Years: 20.00    Pack years: 10.00    Types: Cigarettes  . Smokeless tobacco: Never Used  . Tobacco comment: Quit 2003  Substance and Sexual Activity  . Alcohol use: Yes    Alcohol/week: 0.0 standard drinks    Comment: SOCIAL  . Drug use: No  . Sexual activity: Not on file  Other Topics Concern  . Not on file  Social History Narrative   Lives with wife.  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     Social Determinants of Health   Financial Resource Strain:   . Difficulty of Paying Living Expenses: Not on file  Food Insecurity:   . Worried About Programme researcher, broadcasting/film/video in the Last Year: Not on file   . Ran Out of Food in the Last Year: Not on file  Transportation Needs:   . Lack of Transportation (Medical): Not on file  . Lack of Transportation (Non-Medical): Not on file  Physical Activity:   . Days of Exercise per Week: Not on file  . Minutes of Exercise per Session: Not on file  Stress:   . Feeling of Stress : Not on file  Social Connections:   . Frequency of Communication with Friends and Family: Not on file  . Frequency of Social Gatherings with Friends and Family: Not on file  . Attends Religious Services: Not  on file  . Active Member of Clubs or Organizations: Not on file  . Attends Banker Meetings: Not on file  . Marital Status: Not on file  Intimate Partner Violence:   . Fear of Current or Ex-Partner: Not on file  . Emotionally Abused: Not on file  . Physically Abused: Not on file  . Sexually Abused: Not on file    Family History:   Family History  Problem Relation Age of Onset  . CAD Father 57       died  . Goiter Mother   . Prostate cancer Brother   . Rectal cancer Sister 58  . Depression Sister      ROS:  Please see the history of present illness.  All other ROS reviewed and negative.     Physical Exam/Data:   Vitals:   06/26/2019 1200 07/14/2019 1230 06/23/2019 1533 07/07/2019 1625  BP: 128/88 119/77  123/84  Pulse: (!) 58 (!) 39    Resp: 14 (!) 31  20  Temp:      TempSrc:      SpO2: 100% 98% 98% 99%  Weight:      Height:        Intake/Output Summary (Last 24 hours) at 07/18/2019 1656 Last data filed at 07/01/2019 1328 Gross per 24 hour  Intake 2100 ml  Output 100 ml  Net 2000 ml   Last 3 Weights 06/24/2019 09/07/2018 11/04/2017  Weight (lbs) 143 lb 143 lb 186 lb  Weight (kg) 64.864 kg 64.864 kg 84.369 kg  Some encounter information is confidential and restricted. Go to Review Flowsheets activity to see all data.     Body mass index is 22.4 kg/m.  General:  Frail WM in no acute distress HEENT: normal Lymph: no adenopathy Neck: +  JVD Endocrine:  No thryomegaly Vascular: No carotid bruits; FA pulses 2+ bilaterally without bruits  Cardiac:  normal S1, S2; Irreg Irreg; no murmur  Lungs:  Difficult to assess given mental status; supplemental O2 Abd: soft, nontender, no hepatomegaly  Ext: Mild edema Musculoskeletal:  No deformities, BUE and BLE strength normal and equal Skin: warm and dry  Neuro:  Alert and oriented x 1 Psych:  Normal affect   EKG:  The EKG was personally reviewed and demonstrates:  Afib, 152 bpm, repol abnormalities Telemetry:  Telemetry was personally reviewed and demonstrates:  Afib with NSR rates in the 80s with multiple PACs and PVCs  Relevant CV Studies:  Echo ordered  Laboratory Data:  High Sensitivity Troponin:  No results for input(s): TROPONINIHS in the last 720 hours.   Chemistry Recent Labs  Lab 06/28/2019 1100  NA 144  K 4.0  CL 109  CO2 25  GLUCOSE 66*  BUN 44*  CREATININE 1.45*  CALCIUM 8.4*  GFRNONAA 45*  GFRAA 52*  ANIONGAP 10    Recent Labs  Lab 06/25/2019 1100  PROT 5.3*  ALBUMIN 2.6*  AST 54*  ALT 50*  ALKPHOS 59  BILITOT 2.0*   Hematology Recent Labs  Lab 07/03/2019 1100  WBC 9.8  RBC 4.48  HGB 13.0  HCT 43.5  MCV 97.1  MCH 29.0  MCHC 29.9*  RDW 16.1*  PLT 152   BNP Recent Labs  Lab  1150  BNP 729.9*    DDimer No results for input(s): DDIMER in the last 168 hours.   Radiology/Studies:  CT Head Wo Contrast  Result Date: 07/19/2019 CLINICAL DATA:  Weakness.  Refusing to eat. EXAM: CT HEAD WITHOUT CONTRAST TECHNIQUE:  Contiguous axial images were obtained from the base of the skull through the vertex without intravenous contrast. COMPARISON:  11/30/2018 FINDINGS: Brain: No evidence of acute infarction, hemorrhage, hydrocephalus, extra-axial collection or mass lesion/mass effect. Minor periventricular white matter hypoattenuation is noted consistent with chronic microvascular ischemic change. Vascular: No hyperdense vessel or unexpected  calcification. Skull: Normal. Negative for fracture or focal lesion. Sinuses/Orbits: Globes and orbits are unremarkable. Sinuses are clear. Other: None. IMPRESSION: 1. No acute intracranial abnormalities. 2. Minor chronic microvascular ischemic change. Stable appearance from the prior head CT. Electronically Signed   By: Amie Portland M.D.   On: 06/27/2019 13:02   US Abdomen Complete  Result Date:  CLINICAL DATA:  Elevated liver function test. EXAM: ABDOMEN ULTRASOUND COMPLETE COMPARISON:  CT, 09/07/2018 FINDINGS: Gallbladder: Status post cholecystectomy Common bile duct: Diameter: 4 mm Liver: No focal lesion identified. Within normal limits in parenchymal echogenicity. Portal vein is patent on color Doppler imaging with normal direction of blood flow towards the liver. IVC: No abnormality visualized. Pancreas: Visualized portion unremarkable. Spleen: Size and appearance within normal limits. Right Kidney: Length: 10.9 cm. Mild renal cortical thinning. Parenchymal calcification in the midpole. Midpole cyst, 1.2 cm. Second mid upper pole exophytic cyst, 1.6 cm. No hydronephrosis. Left Kidney: Length: 11.0 cm. Mild renal cortical thinning. Prominent exophytic cyst, midpole, 4.8 x 3.9 x 4 cm. Smaller lower pole cyst measuring 2.2 x 2.0 x 2.2 cm. No stones. No hydronephrosis. Abdominal aorta: Diffuse atherosclerotic irregularity.  No aneurysm. Other findings: Small amount of ascites. Bilateral pleural effusions. IMPRESSION: 1. No acute findings.  Normal sonographic appearance of the liver. 2. Small amount of ascites.  Bilateral pleural effusions. 3. Mild bilateral renal cortical thinning. Bilateral renal cysts. No hydronephrosis. 4. Status post cholecystectomy.  No bile duct dilation. Electronically Signed   By: Amie Portland M.D.   On: 07/15/2019 15:53   DG Chest Portable 1 View  Result Date: 07/09/2019 CLINICAL DATA:  Sepsis EXAM: PORTABLE CHEST 1 VIEW COMPARISON:  09/07/2018 FINDINGS: Cardiac  enlargement. Congestive heart failure with pulmonary edema and moderate pleural effusions. Bibasilar atelectasis. Atherosclerotic aorta. IMPRESSION: Congestive heart failure with pulmonary edema and moderate pleural effusions. Bibasilar compressive atelectasis. Electronically Signed   By: Marlan Palau M.D.   On: 07/02/2019 11:55   {  Assessment and Plan:   Paroxysmal Afib s/p DCCV - Patient has a long history of Afib - In the past he has been on Eliquis. It was previously discontinued due to hemoptysis. CHADSVASC 3 - Patient was previously on Diltiazem but this was decreased due to hypotension - EKG shows he is in Afib RVR. Tele suggests he has been in and out of afib. When he is in NSR rates are in the 80s.  - Pressures appear to be stable. Will start dilt drip  Cardiomyopathy - Documented Nonobstructive cardiomyopathy - Echo in 2016 showed EF 45-50%, mild LVH - Repeat echo has been ordered - CXR is suggestive of CHF with pulmonary edema and moderate pleural effusions - BNP 729 - Patient has mild edema on exam.  - Pressures appear stable. Consider trial of Lasix to see if it improves breathing  Failure to thrive - chronic issues since last year - Work-up as outpatient has so far been unrevealing - Albumin 2.6 - Further work-up per IM - Would recommend Palliative care consult  AKI - creatinine 1.45 on admission. It was 1.09 at the beginning of the year.  Acute Cystitis/AMS - Head CT negative for acute findings - Patient  has chronic indwelling catheter and was being treated for UTI as outpatient - Lactic acid normal - Abx per IM  HTN - In the recent past this was not an issue due to hypotension - Pressures in the ED are stable  For questions or updates, please contact [facility] Please consult [email or web address] for contact info under     Signed, Cadence David Stall, PA-C  2019-07-24 4:56 PM   Personally seen and examined. Agree with above.    85 year old male with  clear failure to thrive, AKI, cystitis with acute mental status changes with chronic indwelling catheter here with his daughter who has been taking care of him personally over the past 2 years as she promised that he would not go to a nursing home here with A. fib RVR in the setting of acute cystitis.  Exam reveals a disheveled man, noncoherent, grunting at times, irregularly irregular rhythm no wheezes, when auscultating however he quickly accelerated his respiratory effort.  No significant edema.  Lab work EKG telemetry all personally reviewed.  Interestingly, on telemetry he does look like he had periods of sinus rhythm with PACs  Assessment and plan  A. fib with RVR -Just received IV digoxin bolus. -We will go ahead and start a diltiazem drip.  Watch for any signs of hypotension.  His daughter states that he was taking 120 mg of long-acting diltiazem at home down from 240. -We are not going to anticoagulate because of his prior issues with bleeding.  Discussed with daughter.  She agrees.  End-of-life issues Ultimately, I discussed with his daughter that given his precipitous decline especially over the past year that we offer them a palliative care consult to discuss upcoming end-of-life issues.  For instance, she states that he would never want CPR or be on a breathing machine.  I think would be nice to have a DNR in place to circumvent any unwanted and aggressive therapies.  Cardiomyopathy -Prior echocardiogram showed EF of 45%.  Perhaps we will need a low-dose of diuretics to help with this issue.  In his current state of AKI with acute cystitis, I think it makes sense to see how he settles out before aggressively diuresing him.  Of course if he becomes hypoxic, add Lasix.  Donato Schultz, MD

## 2019-07-19 NOTE — ED Notes (Signed)
Pt 1st lactic normal, 2nd lactic discontinued.

## 2019-07-20 ENCOUNTER — Inpatient Hospital Stay (HOSPITAL_COMMUNITY): Payer: Medicare PPO

## 2019-07-20 DIAGNOSIS — Z66 Do not resuscitate: Secondary | ICD-10-CM

## 2019-07-20 DIAGNOSIS — Z7189 Other specified counseling: Secondary | ICD-10-CM

## 2019-07-20 DIAGNOSIS — J810 Acute pulmonary edema: Secondary | ICD-10-CM

## 2019-07-20 DIAGNOSIS — I5043 Acute on chronic combined systolic (congestive) and diastolic (congestive) heart failure: Secondary | ICD-10-CM

## 2019-07-20 DIAGNOSIS — J90 Pleural effusion, not elsewhere classified: Secondary | ICD-10-CM

## 2019-07-20 DIAGNOSIS — N179 Acute kidney failure, unspecified: Secondary | ICD-10-CM

## 2019-07-20 DIAGNOSIS — E162 Hypoglycemia, unspecified: Secondary | ICD-10-CM

## 2019-07-20 DIAGNOSIS — N3000 Acute cystitis without hematuria: Secondary | ICD-10-CM

## 2019-07-20 DIAGNOSIS — Z515 Encounter for palliative care: Secondary | ICD-10-CM

## 2019-07-20 DIAGNOSIS — I361 Nonrheumatic tricuspid (valve) insufficiency: Secondary | ICD-10-CM

## 2019-07-20 LAB — CBC WITH DIFFERENTIAL/PLATELET
Abs Immature Granulocytes: 0.27 10*3/uL — ABNORMAL HIGH (ref 0.00–0.07)
Basophils Absolute: 0.1 10*3/uL (ref 0.0–0.1)
Basophils Relative: 1 %
Eosinophils Absolute: 0 10*3/uL (ref 0.0–0.5)
Eosinophils Relative: 0 %
HCT: 46.3 % (ref 39.0–52.0)
Hemoglobin: 13.4 g/dL (ref 13.0–17.0)
Immature Granulocytes: 3 %
Lymphocytes Relative: 5 %
Lymphs Abs: 0.6 10*3/uL — ABNORMAL LOW (ref 0.7–4.0)
MCH: 29 pg (ref 26.0–34.0)
MCHC: 28.9 g/dL — ABNORMAL LOW (ref 30.0–36.0)
MCV: 100.2 fL — ABNORMAL HIGH (ref 80.0–100.0)
Monocytes Absolute: 0.8 10*3/uL (ref 0.1–1.0)
Monocytes Relative: 8 %
Neutro Abs: 9 10*3/uL — ABNORMAL HIGH (ref 1.7–7.7)
Neutrophils Relative %: 83 %
Platelets: 155 10*3/uL (ref 150–400)
RBC: 4.62 MIL/uL (ref 4.22–5.81)
RDW: 16.1 % — ABNORMAL HIGH (ref 11.5–15.5)
WBC: 10.7 10*3/uL — ABNORMAL HIGH (ref 4.0–10.5)
nRBC: 0.3 % — ABNORMAL HIGH (ref 0.0–0.2)

## 2019-07-20 LAB — COMPREHENSIVE METABOLIC PANEL
ALT: 52 U/L — ABNORMAL HIGH (ref 0–44)
AST: 45 U/L — ABNORMAL HIGH (ref 15–41)
Albumin: 2.5 g/dL — ABNORMAL LOW (ref 3.5–5.0)
Alkaline Phosphatase: 61 U/L (ref 38–126)
Anion gap: 12 (ref 5–15)
BUN: 42 mg/dL — ABNORMAL HIGH (ref 8–23)
CO2: 27 mmol/L (ref 22–32)
Calcium: 8.4 mg/dL — ABNORMAL LOW (ref 8.9–10.3)
Chloride: 108 mmol/L (ref 98–111)
Creatinine, Ser: 1.21 mg/dL (ref 0.61–1.24)
GFR calc Af Amer: >60 mL/min (ref >60)
GFR calc non Af Amer: 55 mL/min — ABNORMAL LOW (ref >60)
Glucose, Bld: 129 mg/dL — ABNORMAL HIGH (ref 70–99)
Potassium: 4 mmol/L (ref 3.5–5.1)
Sodium: 147 mmol/L — ABNORMAL HIGH (ref 135–145)
Total Bilirubin: 1.9 mg/dL — ABNORMAL HIGH (ref 0.3–1.2)
Total Protein: 5.1 g/dL — ABNORMAL LOW (ref 6.5–8.1)

## 2019-07-20 LAB — ECHOCARDIOGRAM COMPLETE
Height: 67 in
Weight: 2412.71 oz

## 2019-07-20 MED ORDER — LORAZEPAM 2 MG/ML IJ SOLN
2.0000 mg | INTRAMUSCULAR | Status: DC | PRN
  Administered 2019-07-20: 14:00:00 2 mg via INTRAVENOUS
  Filled 2019-07-20: qty 1

## 2019-07-20 MED ORDER — MORPHINE SULFATE (PF) 2 MG/ML IV SOLN
1.0000 mg | INTRAVENOUS | Status: DC | PRN
  Administered 2019-07-20: 1 mg via INTRAVENOUS
  Filled 2019-07-20: qty 1

## 2019-07-20 MED ORDER — HALOPERIDOL 0.5 MG PO TABS
0.5000 mg | ORAL_TABLET | ORAL | Status: DC | PRN
  Filled 2019-07-20: qty 1

## 2019-07-20 MED ORDER — LORAZEPAM 1 MG PO TABS: 2.0000 mg | ORAL_TABLET | ORAL | Status: DC | PRN

## 2019-07-20 MED ORDER — HALOPERIDOL LACTATE 5 MG/ML IJ SOLN: 0.5000 mg | INTRAMUSCULAR | Status: DC | PRN

## 2019-07-20 MED ORDER — FUROSEMIDE 10 MG/ML IJ SOLN: 40.0000 mg | Freq: Two times a day (BID) | INTRAMUSCULAR | Status: DC

## 2019-07-20 MED ORDER — LORAZEPAM 2 MG/ML PO CONC: 2.0000 mg | ORAL | Status: DC | PRN

## 2019-07-20 MED ORDER — FUROSEMIDE 10 MG/ML IJ SOLN
40.0000 mg | Freq: Once | INTRAMUSCULAR | Status: AC
  Administered 2019-07-20: 08:00:00 40 mg via INTRAVENOUS

## 2019-07-20 MED ORDER — GLYCOPYRROLATE 0.2 MG/ML IJ SOLN: 0.2000 mg | INTRAMUSCULAR | Status: DC | PRN

## 2019-07-20 MED ORDER — GLYCOPYRROLATE 1 MG PO TABS
1.0000 mg | ORAL_TABLET | ORAL | Status: DC | PRN
  Filled 2019-07-20: qty 1

## 2019-07-20 MED ORDER — FUROSEMIDE 10 MG/ML IJ SOLN
INTRAMUSCULAR | Status: AC
  Filled 2019-07-20: qty 4

## 2019-07-20 MED ORDER — POLYVINYL ALCOHOL 1.4 % OP SOLN: 1.0000 [drp] | Freq: Four times a day (QID) | OPHTHALMIC | Status: DC | PRN

## 2019-07-20 MED ORDER — BIOTENE DRY MOUTH MT LIQD: 15.0000 mL | OROMUCOSAL | Status: DC | PRN

## 2019-07-20 MED ORDER — MORPHINE SULFATE (PF) 2 MG/ML IV SOLN
2.0000 mg | INTRAVENOUS | Status: DC | PRN
  Administered 2019-07-20: 14:00:00 2 mg via INTRAVENOUS
  Filled 2019-07-20: qty 1

## 2019-07-20 MED ORDER — HALOPERIDOL LACTATE 2 MG/ML PO CONC
0.5000 mg | ORAL | Status: DC | PRN
  Filled 2019-07-20: qty 0.3

## 2019-07-21 LAB — URINE CULTURE: Culture: >=100000 — AB

## 2019-07-24 LAB — CULTURE, BLOOD (ROUTINE X 2)
Culture: NO GROWTH
Culture: NO GROWTH
Special Requests: ADEQUATE
Special Requests: ADEQUATE

## 2019-07-24 NOTE — Progress Notes (Signed)
PT Cancellation Note  Patient Details Name: Gary Bean MRN: [id number] DOB: 1934/12/12   Cancelled Treatment:    Reason Eval/Treat Not Completed: Patient's level of consciousness RN reports patient is still pretty non-responsive, LOC will prevent him from participating effectively in therapy. Will follow acutely and return when patient is medically appropriate/able to participate.    Madelaine Etienne, DPT, PN1   Supplemental Physical Therapist [facility]    Pager [phone] Acute Rehab Office [phone]

## 2019-07-24 NOTE — Progress Notes (Signed)
Patient's wife stated she thought patient had passed.  This RN and Delice Bison RN found patient lying peacefully in bed not breathing and without a pulse.  Time of death 15:03.

## 2019-07-24 NOTE — Progress Notes (Addendum)
PROGRESS NOTE  Gary Bean WQV:[id number] DOB: 1934-10-15 DOA: 07/02/2019 PCP: Blair Heys, MD   LOS: 1 day   Brief Narrative / Interim history: 85 year old male with paroxysmal A. fib status post DCCV, depression, history of bleeding now off anticoagulation, FTT, hypotension, and ICM, chronic Foley catheter who came to the hospital and was admitted on 1/27 for progressive weakness, found to be in A. fib with RVR.  He had a catheter associated UTI in December with delirium/hallucinations, improved after antibiotics.  He has been progressively weak over the last several days, stopped eating and drinking and stopped taking his Cardizem.  He also had very poor p.o. intake over the last couple days and the wife brought him to the hospital.  He was found to be in A. fib with RVR, hypotension, chest x-ray showed pulmonary edema.  He was also found to have acute kidney injury.  He was given fluid on admission.  Subjective / 24h Interval events: Poorly responsive, shallow respirations and appears to have agonal breathing  Assessment & Plan: Principal Problem Acute metabolic encephalopathy due to urinary tract infection related to chronic Foley with sepsis -Patient urinalysis consistent with a UTI, preliminary urine cultures showed E. coli -He was placed on broad-spectrum antibiotics with cefepime, continue, narrow based on sensitivities  Active Problems Acute hypoxic respiratory failure due to acute on chronic combined CHF, left-sided pleural effusion -Patient with history of combined CHF, had fluid overload on exam but due to hypotension on admission and being dry was given fluids -Respiratory status significantly worsened this morning, chest x-ray showed complete left lung whiteout -Have discussed with critical care regarding potential for thoracentesis but it would be of little benefit in long-term, and also discussed with the wife over the phone and she would not want him to go through  procedures that can potentially cause pain and discomfort. -Placed on Lasix  Chronic A. fib now with RVR -Cardiology consulted, patient was started on Cardizem drip, heart rate better this morning  Acute kidney injury chronic kidney disease stage II -Creatinine has improved with fluids, however he appears to be significantly fluid overloaded.  Will start on scheduled Lasix  Obstructive uropathy with chronic Foley catheter -Continue Foley  Goals of care -Discussed extensively with the patient's wife at bedside as well as twice over the phone this morning.  His breathing seems to have deteriorated significantly, she confirms DNR/DNI.  Patient was very clear that he would not want to be hooked up to breathing machines or be resuscitated in case of a cardiac arrest.  Continue supportive treatment for now with antibiotics, diuretics, if he is to get worse we will transition to comfort.  Palliative also consulted  Failure to thrive, weight loss, severe protein calorie malnutrition -Patient has been having progressive decline for the past 1 to 2 years, this was also mentioned in cardiology note in May 2019.  He looks cachectic, weak, poor p.o. intake prior to admission.  He is now poorly responsive with profound respiratory failure. -Per prior notes he has poor desire to eat  Scheduled Meds: . furosemide      . heparin  5,000 Units Subcutaneous Q8H  . lactobacillus acidophilus  1 tablet Oral Daily  . sertraline  75 mg Oral QPM  . sodium chloride flush  3 mL Intravenous Q12H  . vortioxetine HBr  20 mg Oral QHS   Continuous Infusions: . sodium chloride    . ceFEPime (MAXIPIME) IV    . dextrose 5 % and 0.9%  NaCl Stopped (2019-07-23 0840)  . diltiazem (CARDIZEM) infusion 5 mg/hr (July 23, 2019 0403)   PRN Meds:.sodium chloride, acetaminophen, morphine injection, ondansetron (ZOFRAN) IV, sodium chloride flush  DVT prophylaxis: SCDs, heparin Code Status: DNR Family Communication: Wife at  bedside Patient admitted from: Home Anticipated d/c place: Possibly in hospital death Barriers to d/c: Profound hypoxia  Consultants:  Palliative care Pulmonary critical care  Procedures:  None   Microbiology  None   Antimicrobials: Cefepime 1/27 >>    Objective: Vitals:   07-23-2019 0415 07/23/2019 0500 23-Jul-2019 0513 07-23-19 0735  BP: (!) 115/59  113/71 116/66  Pulse: 89  73 68  Resp: 20  18 (!) 21  Temp:   97.6 F (36.4 C) 98.4 F (36.9 C)  TempSrc:   Oral Axillary  SpO2: 93%  92% 95%  Weight:  68.4 kg    Height:        Intake/Output Summary (Last 24 hours) at 07-23-19 1021 Last data filed at 07/23/19 0900 Gross per 24 hour  Intake 2824.72 ml  Output 250 ml  Net 2574.72 ml   Filed Weights   07/03/2019 1100 07/23/19 0500  Weight: 64.9 kg 68.4 kg    Examination:  Constitutional: Agonal breathing Eyes: no scleral icterus ENMT: Mucous membranes are moist.  Neck: normal, supple Respiratory: Gurgling breath sounds bilaterally, no breath sounds noted on the left Cardiovascular: Irregularly irregular, faint systolic murmur heard.  Trace edema Abdomen: non distended, no tenderness. Bowel sounds positive.  Musculoskeletal: no clubbing / cyanosis.  Skin: no rashes Neurologic: Unresponsive   Data Reviewed: I have independently reviewed following labs and imaging studies  Chest x-ray personally reviewed this morning, complete left lung whiteout  CBC: Recent Labs  Lab 07/22/2019 1100 July 23, 2019 0348  WBC 9.8 10.7*  NEUTROABS 8.1* 9.0*  HGB 13.0 13.4  HCT 43.5 46.3  MCV 97.1 100.2*  PLT 152 176   Basic Metabolic Panel: Recent Labs  Lab 06/25/2019 1100 07-23-19 0348  NA 144 147*  K 4.0 4.0  CL 109 108  CO2 25 27  GLUCOSE 66* 129*  BUN 44* 42*  CREATININE 1.45* 1.21  CALCIUM 8.4* 8.4*   Liver Function Tests: Recent Labs  Lab 07/15/2019 1100 2019/07/23 0348  AST 54* 45*  ALT 50* 52*  ALKPHOS 59 61  BILITOT 2.0* 1.9*  PROT 5.3* 5.1*  ALBUMIN 2.6*  2.5*   Coagulation Profile: Recent Labs  Lab 07/09/2019 1100  INR 1.4*   HbA1C: No results for input(s): HGBA1C in the last 72 hours. CBG: No results for input(s): GLUCAP in the last 168 hours.  Recent Results (from the past 240 hour(s))  Culture, blood (Routine x 2)     Status: None (Preliminary result)   Collection Time: 06/29/2019 11:00 AM   Specimen: BLOOD  Result Value Ref Range Status   Specimen Description BLOOD RIGHT ANTECUBITAL  Final   Special Requests   Final    BOTTLES DRAWN AEROBIC AND ANAEROBIC Blood Culture adequate volume   Culture   Final    NO GROWTH < 24 HOURS Performed at [facility], [address] [place], [place] [address]    Report Status PENDING  Incomplete  Culture, blood (Routine x 2)     Status: None (Preliminary result)   Collection Time: 07/03/2019 11:20 AM   Specimen: BLOOD LEFT FOREARM  Result Value Ref Range Status   Specimen Description BLOOD LEFT FOREARM  Final   Special Requests   Final    BOTTLES DRAWN AEROBIC AND  ANAEROBIC Blood Culture adequate volume   Culture   Final    NO GROWTH < 24 HOURS Performed at [facility], [address] [place], [place] [address]    Report Status PENDING  Incomplete  Urine culture     Status: Abnormal (Preliminary result)   Collection Time: 01-Aug-2019 11:30 AM   Specimen: In/Out Cath Urine  Result Value Ref Range Status   Specimen Description IN/OUT CATH URINE  Final   Special Requests   Final    NONE Performed at [facility], [address], [place], [place] [address]    Culture >=100,000 COLONIES/mL ESCHERICHIA COLI (A)  Final   Report Status PENDING  Incomplete  Respiratory Panel by RT PCR (Flu A&B, Covid) - Nasopharyngeal Swab     Status: None   Collection Time: 2019/08/01  3:10 PM   Specimen: Nasopharyngeal Swab  Result Value Ref Range Status   SARS Coronavirus 2 by RT PCR NEGATIVE NEGATIVE Final    Comment: (NOTE) SARS-CoV-2 target nucleic acids are NOT DETECTED. The  SARS-CoV-2 RNA is generally detectable in upper respiratoy specimens during the acute phase of infection. The lowest concentration of SARS-CoV-2 viral copies this assay can detect is 131 copies/mL. A negative result does not preclude SARS-Cov-2 infection and should not be used as the sole basis for treatment or other patient management decisions. A negative result may occur with  improper specimen collection/handling, submission of specimen other than nasopharyngeal swab, presence of viral mutation(s) within the areas targeted by this assay, and inadequate number of viral copies (<131 copies/mL). A negative result must be combined with clinical observations, patient history, and epidemiological information. The expected result is Negative. Fact Sheet for Patients:  https://www.moore.com/ Fact Sheet for Healthcare Providers:  https://www.young.biz/ This test is not yet ap proved or cleared by the [place] FDA and  has been authorized for detection and/or diagnosis of SARS-CoV-2 by FDA under an Emergency Use Authorization (EUA). This EUA will remain  in effect (meaning this test can be used) for the duration of the COVID-19 declaration under Section 564(b)(1) of the Act, 21 U.S.C. section 360bbb-3(b)(1), unless the authorization is terminated or revoked sooner.    Influenza A by PCR NEGATIVE NEGATIVE Final   Influenza B by PCR NEGATIVE NEGATIVE Final    Comment: (NOTE) The Xpert Xpress SARS-CoV-2/FLU/RSV assay is intended as an aid in  the diagnosis of influenza from Nasopharyngeal swab specimens and  should not be used as a sole basis for treatment. Nasal washings and  aspirates are unacceptable for Xpert Xpress SARS-CoV-2/FLU/RSV  testing. Fact Sheet for Patients: https://www.moore.com/ Fact Sheet for Healthcare Providers: https://www.young.biz/ This test is not yet approved or cleared by the [place] FDA and  has been authorized for detection and/or diagnosis of SARS-CoV-2 by  FDA under an Emergency Use Authorization (EUA). This EUA will remain  in effect (meaning this test can be used) for the duration of the  Covid-19 declaration under Section 564(b)(1) of the Act, 21  U.S.C. section 360bbb-3(b)(1), unless the authorization is  terminated or revoked. Performed at [facility], [address], [place], [place] [address]      Radiology Studies: CT Head Wo Contrast  Result Date: 2019-08-01 CLINICAL DATA:  Weakness.  Refusing to eat. EXAM: CT HEAD WITHOUT CONTRAST TECHNIQUE: Contiguous axial images were obtained from the base of the skull through the vertex without intravenous contrast. COMPARISON:  11/30/2018 FINDINGS: Brain: No evidence of acute infarction, hemorrhage, hydrocephalus,  extra-axial collection or mass lesion/mass effect. Minor periventricular white matter hypoattenuation is noted consistent with chronic microvascular ischemic change. Vascular: No hyperdense vessel or unexpected calcification. Skull: Normal. Negative for fracture or focal lesion. Sinuses/Orbits: Globes and orbits are unremarkable. Sinuses are clear. Other: None. IMPRESSION: 1. No acute intracranial abnormalities. 2. Minor chronic microvascular ischemic change. Stable appearance from the prior head CT. Electronically Signed   By: Amie Portland M.D.   On: 06/30/2019 13:02   US Abdomen Complete  Result Date: 06/27/2019 CLINICAL DATA:  Elevated liver function test. EXAM: ABDOMEN ULTRASOUND COMPLETE COMPARISON:  CT, 09/07/2018 FINDINGS: Gallbladder: Status post cholecystectomy Common bile duct: Diameter: 4 mm Liver: No focal lesion identified. Within normal limits in parenchymal echogenicity. Portal vein is patent on color Doppler imaging with normal direction of blood flow towards the liver. IVC: No abnormality visualized. Pancreas: Visualized portion unremarkable. Spleen: Size and appearance within normal  limits. Right Kidney: Length: 10.9 cm. Mild renal cortical thinning. Parenchymal calcification in the midpole. Midpole cyst, 1.2 cm. Second mid upper pole exophytic cyst, 1.6 cm. No hydronephrosis. Left Kidney: Length: 11.0 cm. Mild renal cortical thinning. Prominent exophytic cyst, midpole, 4.8 x 3.9 x 4 cm. Smaller lower pole cyst measuring 2.2 x 2.0 x 2.2 cm. No stones. No hydronephrosis. Abdominal aorta: Diffuse atherosclerotic irregularity.  No aneurysm. Other findings: Small amount of ascites. Bilateral pleural effusions. IMPRESSION: 1. No acute findings.  Normal sonographic appearance of the liver. 2. Small amount of ascites.  Bilateral pleural effusions. 3. Mild bilateral renal cortical thinning. Bilateral renal cysts. No hydronephrosis. 4. Status post cholecystectomy.  No bile duct dilation. Electronically Signed   By: Amie Portland M.D.   On: 07/23/2019 15:53   DG Chest Port 1 View  Result Date: 2019-08-13 CLINICAL DATA:  CHF. Additional history provided: Weakness, hypotension EXAM: PORTABLE CHEST 1 VIEW COMPARISON:  Chest radiograph 07/15/2019 FINDINGS: The cardiac silhouette is largely obscured. Aortic atherosclerosis. New from prior examination, there is opacity throughout the left hemithorax which may reflect a large pleural effusion and/or atelectasis. Redemonstrated pulmonary edema within the right lung. Persistent moderate right pleural effusion with right basilar atelectasis. No evidence of pneumothorax. Overlying cardiac monitoring leads. These results were called by telephone at the time of interpretation on 13-Aug-2019 at 7:55 am to provider Dr.Gherge, who verbally acknowledged these results. IMPRESSION: Opacity throughout the left hemithorax, new from prior examination. This may reflect a large pleural effusion and/or atelectasis. Consider ultrasound for further evaluation. Pulmonary edema within the right lung. Persistent moderate right pleural effusion with right basilar atelectasis.  Electronically Signed   By: Jackey Loge DO   On: Aug 13, 2019 07:56   DG Chest Portable 1 View  Result Date: 07/23/2019 CLINICAL DATA:  Sepsis EXAM: PORTABLE CHEST 1 VIEW COMPARISON:  09/07/2018 FINDINGS: Cardiac enlargement. Congestive heart failure with pulmonary edema and moderate pleural effusions. Bibasilar atelectasis. Atherosclerotic aorta. IMPRESSION: Congestive heart failure with pulmonary edema and moderate pleural effusions. Bibasilar compressive atelectasis. Electronically Signed   By: Marlan Palau M.D.   On: 07/04/2019 11:55     Time spent: 35 minutes and multiple visits at bedside for patient evaluation, discussing with the wife GOC, d/w PCCM at bedside   Pamella Pert, MD, PhD [facility]  Between 7 am - 7 pm I am available, please contact me via Amion or Securechat  Between 7 pm - 7 am I am not available, please contact night coverage MD/APP via Amion

## 2019-07-24 NOTE — Progress Notes (Signed)
  Echocardiogram 2D Echocardiogram has been performed.  Gary Bean August 14, 2019, 9:29 AM

## 2019-07-24 NOTE — Discharge Summary (Addendum)
Death Summary  Gary Bean FRE:[id number] DOB: February 15, 1935 DOA: 2019/07/23  PCP: Blair Heys, MD  Admit date: 2019-07-23 Date of Death: 2019-07-24 Time of Death: 3:05 pm Notification: Blair Heys, MD notified of death of 07/24/19   History of present illness:  Gary Bean is a 85 y.o. male with medical history significant of paroxysmalatrial fibrillation s/p DCCV,depression,history of bleeding off DOAC, failure to thrive, hypotension,non-ischemic cardiomyopathy, and chronic indwelling Foley catheterwho is being seen today for the evaluation ofafib and heartfailure. Had a catheter associated UTI in December patient has visual and auditory hallucination, received 5 days of Keflex completed on January 5 (and his Foley was exchanged on December 30).  His hallucination symptoms much improved.  2 days ago patient started to feel weak and sleepy.  He stopped eating and drinking and taking his Cardizem for the last 2 days according to patient's wife.  His wife has been feeding him with soft diet and liquid, and the whole day yesterday she was only able to feed him with few sips of water. ED Course: Patient was found to be in fast A. fib, and borderline hypotension, patient received 2 L of IV fluid, x-ray showed pulmonary edema.  Work-up showed patient has AKI.  Final Diagnoses:  Sepsis due to UTI due to chronic Foley, with endorgan damage, present on admission Acute metabolic encephalopathy, present on admission Acute kidney injury on chronic kidney disease stage VII, present on admission Acute hypoxic respiratory failure due to acute on chronic combined CHF and left-sided pleural effusion, acute pulmonary edema, present on admission Left lung collapse, left-sided pleural effusion Chronic A. fib with intermittent RVR, present on admission Obstructive uropathy with chronic Foley catheter, present on admission Failure to thrive, weight loss, severe protein calorie malnutrition -he has  been having progressive decline for the past 1 to 2 years, also mentioned an outpatient notes in 2019.  He is cachectic, weak, has poor p.o. intake prior to admission, he was admitted to the hospital with sepsis due to UTI in the setting of chronic Foley, received broad-spectrum IV antibiotics however he clinically declined, remains profoundly encephalopathic, did not respond to treatment, repeat chest x-ray also showed left lung collapse due to large left-sided pleural effusion.  After discussing with the family as well, I recommend cath patient was transitioned towards comfort care.  Passed on 2022-07-23 at [phone]. Goals of care discussion, comfort care, end-of-life care   The results of significant diagnostics from this hospitalization (including imaging, microbiology, ancillary and laboratory) are listed below for reference.    Significant Diagnostic Studies: CT Head Wo Contrast  Result Date: 2019-07-23 CLINICAL DATA:  Weakness.  Refusing to eat. EXAM: CT HEAD WITHOUT CONTRAST TECHNIQUE: Contiguous axial images were obtained from the base of the skull through the vertex without intravenous contrast. COMPARISON:  11/30/2018 FINDINGS: Brain: No evidence of acute infarction, hemorrhage, hydrocephalus, extra-axial collection or mass lesion/mass effect. Minor periventricular white matter hypoattenuation is noted consistent with chronic microvascular ischemic change. Vascular: No hyperdense vessel or unexpected calcification. Skull: Normal. Negative for fracture or focal lesion. Sinuses/Orbits: Globes and orbits are unremarkable. Sinuses are clear. Other: None. IMPRESSION: 1. No acute intracranial abnormalities. 2. Minor chronic microvascular ischemic change. Stable appearance from the prior head CT. Electronically Signed   By: Amie Portland M.D.   On: 07-23-2019 13:02   US Abdomen Complete  Result Date: 07-23-2019 CLINICAL DATA:  Elevated liver function test. EXAM: ABDOMEN ULTRASOUND COMPLETE COMPARISON:  CT,  09/07/2018 FINDINGS: Gallbladder: Status post cholecystectomy Common  bile duct: Diameter: 4 mm Liver: No focal lesion identified. Within normal limits in parenchymal echogenicity. Portal vein is patent on color Doppler imaging with normal direction of blood flow towards the liver. IVC: No abnormality visualized. Pancreas: Visualized portion unremarkable. Spleen: Size and appearance within normal limits. Right Kidney: Length: 10.9 cm. Mild renal cortical thinning. Parenchymal calcification in the midpole. Midpole cyst, 1.2 cm. Second mid upper pole exophytic cyst, 1.6 cm. No hydronephrosis. Left Kidney: Length: 11.0 cm. Mild renal cortical thinning. Prominent exophytic cyst, midpole, 4.8 x 3.9 x 4 cm. Smaller lower pole cyst measuring 2.2 x 2.0 x 2.2 cm. No stones. No hydronephrosis. Abdominal aorta: Diffuse atherosclerotic irregularity.  No aneurysm. Other findings: Small amount of ascites. Bilateral pleural effusions. IMPRESSION: 1. No acute findings.  Normal sonographic appearance of the liver. 2. Small amount of ascites.  Bilateral pleural effusions. 3. Mild bilateral renal cortical thinning. Bilateral renal cysts. No hydronephrosis. 4. Status post cholecystectomy.  No bile duct dilation. Electronically Signed   By: Amie Portland M.D.   On: 07/18/2019 15:53   DG Chest Port 1 View  Result Date: 2019/08/15 CLINICAL DATA:  CHF. Additional history provided: Weakness, hypotension EXAM: PORTABLE CHEST 1 VIEW COMPARISON:  Chest radiograph 07/04/2019 FINDINGS: The cardiac silhouette is largely obscured. Aortic atherosclerosis. New from prior examination, there is opacity throughout the left hemithorax which may reflect a large pleural effusion and/or atelectasis. Redemonstrated pulmonary edema within the right lung. Persistent moderate right pleural effusion with right basilar atelectasis. No evidence of pneumothorax. Overlying cardiac monitoring leads. These results were called by telephone at the time of  interpretation on 15-Aug-2019 at 7:55 am to provider Dr.Gherge, who verbally acknowledged these results. IMPRESSION: Opacity throughout the left hemithorax, new from prior examination. This may reflect a large pleural effusion and/or atelectasis. Consider ultrasound for further evaluation. Pulmonary edema within the right lung. Persistent moderate right pleural effusion with right basilar atelectasis. Electronically Signed   By: Jackey Loge DO   On: 2019/08/15 07:56   DG Chest Portable 1 View  Result Date: 06/27/2019 CLINICAL DATA:  Sepsis EXAM: PORTABLE CHEST 1 VIEW COMPARISON:  09/07/2018 FINDINGS: Cardiac enlargement. Congestive heart failure with pulmonary edema and moderate pleural effusions. Bibasilar atelectasis. Atherosclerotic aorta. IMPRESSION: Congestive heart failure with pulmonary edema and moderate pleural effusions. Bibasilar compressive atelectasis. Electronically Signed   By: Marlan Palau M.D.   On: 07/21/2019 11:55   ECHOCARDIOGRAM COMPLETE  Result Date: 08-15-19   ECHOCARDIOGRAM REPORT   Patient Name:   Gary Bean Date of Exam: Aug 15, 2019 Medical Rec #:  [id number]       Height:       67.0 in Accession #:    [id number]      Weight:       150.8 lb Date of Birth:  1935/02/17        BSA:          1.79 m Patient Age:    84 years        BP:           116/66 mmHg Patient Gender: M               HR:           92 bpm. Exam Location:  Inpatient Procedure: 2D Echo Indications:    CHF 428.21  History:        Patient has prior history of Echocardiogram examinations, most  recent 10/16/2014. Arrythmias:Atrial Fibrillation; Risk                 Factors:Hypertension and Former Smoker.  Sonographer:    Jannett Celestine RDCS (AE) Referring Phys: [id number] Lequita Halt  Sonographer Comments: restricted mobility IMPRESSIONS  1. Left ventricular ejection fraction, by visual estimation, is 35 to 40%. The left ventricle has moderately decreased function. There is no left ventricular hypertrophy.   2. Left ventricular diastolic function could not be evaluated.  3. Mildly dilated left ventricular internal cavity size.  4. The left ventricle demonstrates global hypokinesis.  5. Global right ventricle has mildly reduced systolic function.The right ventricular size is normal. No increase in right ventricular wall thickness.  6. Left atrial size was normal.  7. Right atrial size was mildly dilated.  8. Trivial pericardial effusion is present.  9. The mitral valve is normal in structure. No evidence of mitral valve regurgitation. No evidence of mitral stenosis. 10. The tricuspid valve is normal in structure. 11. The tricuspid valve is normal in structure. Tricuspid valve regurgitation is moderate. 12. The aortic valve is tricuspid. Aortic valve regurgitation is not visualized. No evidence of aortic valve sclerosis or stenosis. 13. The pulmonic valve was normal in structure. Pulmonic valve regurgitation is not visualized. 14. Aortic dilatation noted. 15. There is mild dilatation of the ascending aorta measuring 40 mm. 16. Moderately elevated pulmonary artery systolic pressure. 17. The inferior vena cava is dilated in size with <50% respiratory variability, suggesting right atrial pressure of 15 mmHg. FINDINGS  Left Ventricle: Left ventricular ejection fraction, by visual estimation, is 35 to 40%. The left ventricle has moderately decreased function. The left ventricle demonstrates global hypokinesis. The left ventricular internal cavity size was mildly dilated left ventricle. There is no left ventricular hypertrophy. The left ventricular diastology could not be evaluated due to atrial fibrillation. Left ventricular diastolic function could not be evaluated. Normal left atrial pressure. Right Ventricle: The right ventricular size is normal. No increase in right ventricular wall thickness. Global RV systolic function is has mildly reduced systolic function. The tricuspid regurgitant velocity is 3.23 m/s, and with an  assumed right atrial pressure of 8 mmHg, the estimated right ventricular systolic pressure is moderately elevated at 49.7 mmHg. Left Atrium: Left atrial size was normal in size. Right Atrium: Right atrial size was mildly dilated Pericardium: Trivial pericardial effusion is present. There is a small pleural effusion in the left lateral region. Mitral Valve: The mitral valve is normal in structure. No evidence of mitral valve regurgitation. No evidence of mitral valve stenosis by observation. Tricuspid Valve: The tricuspid valve is normal in structure. Tricuspid valve regurgitation is moderate. Aortic Valve: The aortic valve is tricuspid. Aortic valve regurgitation is not visualized. The aortic valve is structurally normal, with no evidence of sclerosis or stenosis. Pulmonic Valve: The pulmonic valve was normal in structure. Pulmonic valve regurgitation is not visualized. Pulmonic regurgitation is not visualized. Aorta: The aortic root, ascending aorta and aortic arch are all structurally normal, with no evidence of dilitation or obstruction and aortic dilatation noted. There is mild dilatation of the ascending aorta measuring 40 mm. Venous: The inferior vena cava is dilated in size with less than 50% respiratory variability, suggesting right atrial pressure of 15 mmHg. IAS/Shunts: No atrial level shunt detected by color flow Doppler. There is no evidence of a patent foramen ovale. No ventricular septal defect is seen or detected. There is no evidence of an atrial septal defect.  LEFT VENTRICLE PLAX  2D LVIDd:         5.70 cm LVIDs:         4.70 cm LV PW:         1.10 cm LV IVS:        0.80 cm LVOT diam:     2.30 cm LV SV:         58 ml LV SV Index:   31.98 LVOT Area:     4.15 cm  RIGHT VENTRICLE RV S prime:     11.00 cm/s TAPSE (M-mode): 1.6 cm LEFT ATRIUM             Index       RIGHT ATRIUM           Index LA diam:        3.80 cm 2.12 cm/m  RA Area:     21.10 cm LA Vol (A2C):   49.5 ml 27.60 ml/m RA Volume:    60.10 ml  33.51 ml/m LA Vol (A4C):   45.1 ml 25.15 ml/m LA Biplane Vol: 48.3 ml 26.93 ml/m  AORTIC VALVE LVOT Vmax:   96.00 cm/s LVOT Vmean:  62.000 cm/s LVOT VTI:    0.137 m  AORTA Ao Root diam: 3.10 cm MITRAL VALVE                        TRICUSPID VALVE MV Area (PHT): 3.89 cm             TR Peak grad:   41.7 mmHg MV PHT:        56.55 msec           TR Vmax:        323.00 cm/s MV Decel Time: 195 msec MV E velocity: 120.00 cm/s 103 cm/s SHUNTS                                     Systemic VTI:  0.14 m                                     Systemic Diam: 2.30 cm  Chilton Si MD Electronically signed by Chilton Si MD Signature Date/Time: 2019-08-19/12:31:09 PM    Final     Microbiology: Recent Results (from the past 240 hour(s))  Culture, blood (Routine x 2)     Status: None (Preliminary result)   Collection Time: 07/18/2019 11:00 AM   Specimen: BLOOD  Result Value Ref Range Status   Specimen Description BLOOD RIGHT ANTECUBITAL  Final   Special Requests   Final    BOTTLES DRAWN AEROBIC AND ANAEROBIC Blood Culture adequate volume   Culture   Final    NO GROWTH < 24 HOURS Performed at [facility], [address], [place], [place] [address]    Report Status PENDING  Incomplete  Culture, blood (Routine x 2)     Status: None (Preliminary result)   Collection Time: 07/22/2019 11:20 AM   Specimen: BLOOD LEFT FOREARM  Result Value Ref Range Status   Specimen Description BLOOD LEFT FOREARM  Final   Special Requests   Final    BOTTLES DRAWN AEROBIC AND ANAEROBIC Blood Culture adequate volume   Culture   Final    NO GROWTH < 24 HOURS Performed at [facility], [address] [place], [place] [address]    Report Status PENDING  Incomplete  Urine culture     Status: Abnormal (Preliminary result)   Collection Time: 08/11/19 11:30 AM   Specimen: In/Out Cath Urine  Result Value Ref Range Status   Specimen Description IN/OUT CATH URINE  Final   Special Requests   Final     NONE Performed at [facility], [address] [place], [place] [address]    Culture >=100,000 COLONIES/mL ESCHERICHIA COLI (A)  Final   Report Status PENDING  Incomplete  Respiratory Panel by RT PCR (Flu A&B, Covid) - Nasopharyngeal Swab     Status: None   Collection Time: Aug 11, 2019  3:10 PM   Specimen: Nasopharyngeal Swab  Result Value Ref Range Status   SARS Coronavirus 2 by RT PCR NEGATIVE NEGATIVE Final    Comment: (NOTE) SARS-CoV-2 target nucleic acids are NOT DETECTED. The SARS-CoV-2 RNA is generally detectable in upper respiratoy specimens during the acute phase of infection. The lowest concentration of SARS-CoV-2 viral copies this assay can detect is 131 copies/mL. A negative result does not preclude SARS-Cov-2 infection and should not be used as the sole basis for treatment or other patient management decisions. A negative result may occur with  improper specimen collection/handling, submission of specimen other than nasopharyngeal swab, presence of viral mutation(s) within the areas targeted by this assay, and inadequate number of viral copies (<131 copies/mL). A negative result must be combined with clinical observations, patient history, and epidemiological information. The expected result is Negative. Fact Sheet for Patients:  https://www.moore.com/ Fact Sheet for Healthcare Providers:  https://www.young.biz/ This test is not yet ap proved or cleared by the [place] FDA and  has been authorized for detection and/or diagnosis of SARS-CoV-2 by FDA under an Emergency Use Authorization (EUA). This EUA will remain  in effect (meaning this test can be used) for the duration of the COVID-19 declaration under Section 564(b)(1) of the Act, 21 U.S.C. section 360bbb-3(b)(1), unless the authorization is terminated or revoked sooner.    Influenza A by PCR NEGATIVE NEGATIVE Final   Influenza B by PCR NEGATIVE NEGATIVE Final     Comment: (NOTE) The Xpert Xpress SARS-CoV-2/FLU/RSV assay is intended as an aid in  the diagnosis of influenza from Nasopharyngeal swab specimens and  should not be used as a sole basis for treatment. Nasal washings and  aspirates are unacceptable for Xpert Xpress SARS-CoV-2/FLU/RSV  testing. Fact Sheet for Patients: https://www.moore.com/ Fact Sheet for Healthcare Providers: https://www.young.biz/ This test is not yet approved or cleared by the [place] FDA and  has been authorized for detection and/or diagnosis of SARS-CoV-2 by  FDA under an Emergency Use Authorization (EUA). This EUA will remain  in effect (meaning this test can be used) for the duration of the  Covid-19 declaration under Section 564(b)(1) of the Act, 21  U.S.C. section 360bbb-3(b)(1), unless the authorization is  terminated or revoked. Performed at [facility], [address], [place], [place] [address]      Labs: Basic Metabolic Panel: Recent Labs  Lab 08/11/19 1100 06/26/2019 0348  NA 144 147*  K 4.0 4.0  CL 109 108  CO2 25 27  GLUCOSE 66* 129*  BUN 44* 42*  CREATININE 1.45* 1.21  CALCIUM 8.4* 8.4*   Liver Function Tests: Recent Labs  Lab Aug 11, 2019 1100 07/21/2019 0348  AST 54* 45*  ALT 50* 52*  ALKPHOS 59 61  BILITOT 2.0* 1.9*  PROT 5.3* 5.1*  ALBUMIN 2.6* 2.5*  No results for input(s): LIPASE, AMYLASE in the last 168 hours. Recent Labs  Lab 15-Aug-2019 1807  AMMONIA 19   CBC: Recent Labs  Lab Aug 15, 2019 1100 07/22/2019 0348  WBC 9.8 10.7*  NEUTROABS 8.1* 9.0*  HGB 13.0 13.4  HCT 43.5 46.3  MCV 97.1 100.2*  PLT 152 155   Cardiac Enzymes: No results for input(s): CKTOTAL, CKMB, CKMBINDEX, TROPONINI in the last 168 hours. D-Dimer No results for input(s): DDIMER in the last 72 hours. BNP: Invalid input(s): POCBNP CBG: No results for input(s): GLUCAP in the last 168 hours. Anemia work up No results for input(s): VITAMINB12, FOLATE,  FERRITIN, TIBC, IRON, RETICCTPCT in the last 72 hours. Urinalysis    Component Value Date/Time   COLORURINE AMBER (A) 2019-08-15 1200   APPEARANCEUR HAZY (A) 08/15/19 1200   LABSPEC 1.017 2019-08-15 1200   PHURINE 5.0 August 15, 2019 1200   GLUCOSEU NEGATIVE 08-15-19 1200   HGBUR MODERATE (A) 08-15-19 1200   BILIRUBINUR NEGATIVE 08-15-19 1200   KETONESUR NEGATIVE 08/15/19 1200   PROTEINUR 100 (A) 08-15-19 1200   NITRITE NEGATIVE Aug 15, 2019 1200   LEUKOCYTESUR LARGE (A) 08/15/2019 1200   Sepsis Labs Invalid input(s): PROCALCITONIN,  WBC,  LACTICIDVEN     SIGNED:  Pamella Pert, MD  [facility] 06/28/2019, 3:20 PM Pager   If 7PM-7AM, please contact night-coverage [email or web address] Password [email or web address]

## 2019-07-24 NOTE — Consult Note (Signed)
Consultation Note Date: Aug 14, 2019   Patient Name: Gary Bean  DOB: 1935/06/03  MRN: [id number]  Age / Sex: 85 y.o., male  PCP: Gary Arabian, MD Referring Physician: Caren Griffins, MD  Reason for Consultation: Establishing goals of care  HPI/Patient Profile: 85 y.o. male  with past medical history of paroxysmal a fib, depression, FTT hypotension, chronic foley  admitted on 06/28/2019 with progressive weakness, not eating, in a fib with RVR. Has had recurrent catheter associated UTI's and sepsis. He had stopped eating and drinking and taking home medications. Workup reveals continued a fib with RVR, pulmonary edema, acute kidney injury. Palliative medicine consulted for goals of care.   Clinical Assessment and Goals of Care: Evaluated patient and met at bedside with spouse. Patient in bed- nonresponsive, agonal respirations on NRB facemask. Patient was DNR on admission- per spouse he adamantly does not want to be intubated.  Gary Bean notes Gary Bean has been on a downward decline and has had a severe depression over the last two years. She tells me that she understands that he "is not going to pull through". She says her hopes are that he dies with comfort and peace.  Gary Bean was known to be very kind hearted and loved Gary Bean' music. I encouraged Gary Bean to play the music that he loves for him.  We discussed interventions at this point that are prolonging his body's dying process- IV fluids, IV antibiotics, high levels of oxygen. We discussed a full transition to comfort measures and allowing for natural death with symptom control medications to ensure comfort. I discussed that after he is made comfortable with medicines it would be appropriate to take off the face mask as it will no longer be a factor in comfort and will prolong his dying.  Gary Bean asked about prognosis- I gently explained that I  would not be surprised if he was with [place] for only a few more hours to a day. Gary Bean agreed that this would all be in line with patient's goals of care.  She notes they are members of [employer] and requests prayer.    Primary Decision Maker NEXT OF KIN- spouse- Gary Bean    SUMMARY OF RECOMMENDATIONS   -D/C IV fluids, antibiotics, labs -Increase morphine to 42m q15 min prn for air hunger -Lorazepam 2 mg q4hr prn air hunger, agitation, or signs of discomfort -When medicine on board and patient appears comfortable- wean off NRB to room air- give medications for signs of SOB or air hunger -Do not check pulse ox, do not titrate oxygen up -Chaplain consult for prayer with actively dying patient  Code Status/Advance Care Planning:  DNR  Palliative Prophylaxis:   Frequent Pain Assessment  Additional Recommendations (Limitations, Scope, Preferences):  Full Comfort Care  Prognosis:    Hours - Days  Discharge Planning: Anticipated Hospital Death  Primary Diagnoses: Present on Admission: . Atrial fibrillation ([place]   I have reviewed the medical record, interviewed the patient and family, and examined the patient. The following aspects are  pertinent.  Past Medical History:  Diagnosis Date  . Anxiety   . Atrial fibrillation ([place])   . Depression   . Dizzy 07/27/2017  . Hypertension   . Nephrolithiasis    Social History   Socioeconomic History  . Marital status: Married    Spouse name: Not on file  . Number of children: 1  . Years of education: Not on file  . Highest education level: Not on file  Occupational History  . Not on file  Tobacco Use  . Smoking status: Former Smoker    Packs/day: 0.50    Years: 20.00    Pack years: 10.00    Types: Cigarettes  . Smokeless tobacco: Never Used  . Tobacco comment: Quit 2003  Substance and Sexual Activity  . Alcohol use: Yes    Alcohol/week: 0.0 standard drinks    Comment: SOCIAL  . Drug use: No  . Sexual activity:  Not on file  Other Topics Concern  . Not on file  Social History Narrative   Lives with wife.  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     Social Determinants of Health   Financial Resource Strain:   . Difficulty of Paying Living Expenses: Not on file  Food Insecurity:   . Worried About Charity fundraiser in the Last Year: Not on file  . Ran Out of Food in the Last Year: Not on file  Transportation Needs:   . Lack of Transportation (Medical): Not on file  . Lack of Transportation (Non-Medical): Not on file  Physical Activity:   . Days of Exercise per Week: Not on file  . Minutes of Exercise per Session: Not on file  Stress:   . Feeling of Stress : Not on file  Social Connections:   . Frequency of Communication with Friends and Family: Not on file  . Frequency of Social Gatherings with Friends and Family: Not on file  . Attends Religious Services: Not on file  . Active Member of Clubs or Organizations: Not on file  . Attends Archivist Meetings: Not on file  . Marital Status: Not on file   Family History  Problem Relation Age of Onset  . CAD Father 83       died  . Goiter Mother   . Prostate cancer Brother   . Rectal cancer Sister 76  . Depression Sister    Scheduled Meds: Continuous Infusions: PRN Meds:.acetaminophen, antiseptic oral rinse, glycopyrrolate **OR** glycopyrrolate **OR** glycopyrrolate, haloperidol **OR** haloperidol **OR** haloperidol lactate, LORazepam **OR** LORazepam **OR** LORazepam, morphine injection, ondansetron (ZOFRAN) IV, polyvinyl alcohol Medications Prior to Admission:  Prior to Admission medications   Medication Sig Start Date End Date Taking? Authorizing Provider  acetaminophen (TYLENOL) 500 MG tablet Take 500 mg by mouth every 6 (six) hours as needed for mild pain or headache.    Yes [provider]  sertraline (ZOLOFT) 50 MG tablet Take 1.5 tablets (75 mg total) by mouth daily. Patient taking  differently: Take 75 mg by mouth See admin instructions. Take 75 mg by mouth once a day at 3 PM 11/10/17  Yes Arfeen, Arlyce Harman, MD  TIADYLT ER 120 MG 24 hr capsule Take 120 mg by mouth daily. 04/25/19  Yes [provider]  TRINTELLIX 20 MG TABS tablet Take 20 mg by mouth at bedtime. 08/20/18  Yes [provider]   Allergies  Allergen Reactions  . Eliquis [Apixaban] Other (See Comments)    Made the patient's  gums bleed profusely  . Xarelto [Rivaroxaban] Other (See Comments)    Made the patient's gums bleed profusely   Review of Systems  Unable to perform ROS   Physical Exam Vitals reviewed.  Cardiovascular:     Rate and Rhythm: Rhythm irregular.     Pulses: Normal pulses.  Pulmonary:     Breath sounds: Wheezing present.     Comments: Agonal respirations Neurological:     Comments: nonresponsive     Vital Signs: BP (!) 108/59 (BP Location: Left Arm)   Pulse 86   Temp (!) 97.4 F (36.3 C) (Axillary)   Resp 12   Ht '5\' 7"'  (1.702 m)   Wt 68.4 kg   SpO2 95%   BMI 23.62 kg/m  Pain Scale: Faces   Pain Score: 0-No pain   SpO2: SpO2: 95 % O2 Device:SpO2: 95 % O2 Flow Rate: .O2 Flow Rate (L/min): 15 L/min  IO: Intake/output summary:   Intake/Output Summary (Last 24 hours) at 08/11/19 1342 Last data filed at 08-11-2019 1340 Gross per 24 hour  Intake 724.72 ml  Output 250 ml  Net 474.72 ml    LBM: Last BM Date: 07/10/2019 Baseline Weight: Weight: 64.9 kg Most recent weight: Weight: 68.4 kg     Palliative Assessment/Data: PPS: 10%     Thank you for this consult. Palliative medicine will continue to follow and assist as needed.   Time In: 1300 Time Out: 1410 Time Total: 70 minutes Greater than 50%  of this time was spent counseling and coordinating care related to the above assessment and plan.  Signed by: Mariana Kaufman, AGNP-C Palliative Medicine    Please contact Palliative Medicine Team phone at [phone] for questions and concerns.  For  individual provider: See Shea Evans

## 2019-07-24 NOTE — Progress Notes (Signed)
[facility] called  Reference# [id number]  Spoke with Erasmo Score .Dashanna Kinnamon, Randall An RN

## 2019-07-24 NOTE — Progress Notes (Signed)
Progress Note  Patient Name: Gary Bean Date of Encounter: 07/16/2019  Primary Cardiologist: Minus Breeding, MD   Subjective   Pt is on non-rebreather mask. He has intermittent chain stokes respirations. He is not verbally responsive. He has mottling of his fingers but not his feet. His wife is present and states that she is aware of the grave nature of the patient's condition.  He does have good amount of urine in his foley bag.   Inpatient Medications    Scheduled Meds: . furosemide      . heparin  5,000 Units Subcutaneous Q8H  . lactobacillus acidophilus  1 tablet Oral Daily  . sertraline  75 mg Oral QPM  . sodium chloride flush  3 mL Intravenous Q12H  . vortioxetine HBr  20 mg Oral QHS   Continuous Infusions: . sodium chloride    . ceFEPime (MAXIPIME) IV    . dextrose 5 % and 0.9% NaCl Stopped (07/03/2019 0840)  . diltiazem (CARDIZEM) infusion 5 mg/hr (07/14/2019 0403)   PRN Meds: sodium chloride, acetaminophen, ondansetron (ZOFRAN) IV, sodium chloride flush   Vital Signs    Vitals:   07/17/2019 0415 07/01/2019 0500 06/25/2019 0513 07/16/2019 0735  BP: (!) 115/59  113/71 116/66  Pulse: 89  73 68  Resp: 20  18 (!) 21  Temp:   97.6 F (36.4 C) 98.4 F (36.9 C)  TempSrc:   Oral Axillary  SpO2: 93%  92% 95%  Weight:  68.4 kg    Height:        Intake/Output Summary (Last 24 hours) at 07/11/2019 1008 Last data filed at 06/26/2019 0900 Gross per 24 hour  Intake 2824.72 ml  Output 250 ml  Net 2574.72 ml   Last 3 Weights 07/15/2019 2019/07/28 09/07/2018  Weight (lbs) 150 lb 12.7 oz 143 lb 143 lb  Weight (kg) 68.4 kg 64.864 kg 64.864 kg  Some encounter information is confidential and restricted. Go to Review Flowsheets activity to see all data.      Telemetry    afib in the 80's - Personally Reviewed  ECG    No new tracings for review.  Physical Exam   GEN: Acutely ill appearing, on NRB mask.   Neck: + JVD Cardiac: irregularly irregular rhythm, no murmurs,  rubs, or gallops.  Respiratory: Scattered crackles and diminished bases. GI: Soft MS: Trace lower leg edema; Neuro:  Not verbally responsive. Moves spontaneously  Psych: Withdrawn  Labs    High Sensitivity Troponin:  No results for input(s): TROPONINIHS in the last 720 hours.    Chemistry Recent Labs  Lab 07-28-19 1100 07/14/2019 0348  NA 144 147*  K 4.0 4.0  CL 109 108  CO2 25 27  GLUCOSE 66* 129*  BUN 44* 42*  CREATININE 1.45* 1.21  CALCIUM 8.4* 8.4*  PROT 5.3* 5.1*  ALBUMIN 2.6* 2.5*  AST 54* 45*  ALT 50* 52*  ALKPHOS 59 61  BILITOT 2.0* 1.9*  GFRNONAA 45* 55*  GFRAA 52* >60  ANIONGAP 10 12     Hematology Recent Labs  Lab 2019-07-28 1100 07/09/2019 0348  WBC 9.8 10.7*  RBC 4.48 4.62  HGB 13.0 13.4  HCT 43.5 46.3  MCV 97.1 100.2*  MCH 29.0 29.0  MCHC 29.9* 28.9*  RDW 16.1* 16.1*  PLT 152 155    BNP Recent Labs  Lab 2019/07/28 1150  BNP 729.9*     DDimer No results for input(s): DDIMER in the last 168 hours.   Radiology  CT Head Wo Contrast  Result Date: Jul 27, 2019 CLINICAL DATA:  Weakness.  Refusing to eat. EXAM: CT HEAD WITHOUT CONTRAST TECHNIQUE: Contiguous axial images were obtained from the base of the skull through the vertex without intravenous contrast. COMPARISON:  11/30/2018 FINDINGS: Brain: No evidence of acute infarction, hemorrhage, hydrocephalus, extra-axial collection or mass lesion/mass effect. Minor periventricular white matter hypoattenuation is noted consistent with chronic microvascular ischemic change. Vascular: No hyperdense vessel or unexpected calcification. Skull: Normal. Negative for fracture or focal lesion. Sinuses/Orbits: Globes and orbits are unremarkable. Sinuses are clear. Other: None. IMPRESSION: 1. No acute intracranial abnormalities. 2. Minor chronic microvascular ischemic change. Stable appearance from the prior head CT. Electronically Signed   By: Amie Portland M.D.   On: 2019/07/27 13:02   US Abdomen Complete  Result  Date: 2019-07-27 CLINICAL DATA:  Elevated liver function test. EXAM: ABDOMEN ULTRASOUND COMPLETE COMPARISON:  CT, 09/07/2018 FINDINGS: Gallbladder: Status post cholecystectomy Common bile duct: Diameter: 4 mm Liver: No focal lesion identified. Within normal limits in parenchymal echogenicity. Portal vein is patent on color Doppler imaging with normal direction of blood flow towards the liver. IVC: No abnormality visualized. Pancreas: Visualized portion unremarkable. Spleen: Size and appearance within normal limits. Right Kidney: Length: 10.9 cm. Mild renal cortical thinning. Parenchymal calcification in the midpole. Midpole cyst, 1.2 cm. Second mid upper pole exophytic cyst, 1.6 cm. No hydronephrosis. Left Kidney: Length: 11.0 cm. Mild renal cortical thinning. Prominent exophytic cyst, midpole, 4.8 x 3.9 x 4 cm. Smaller lower pole cyst measuring 2.2 x 2.0 x 2.2 cm. No stones. No hydronephrosis. Abdominal aorta: Diffuse atherosclerotic irregularity.  No aneurysm. Other findings: Small amount of ascites. Bilateral pleural effusions. IMPRESSION: 1. No acute findings.  Normal sonographic appearance of the liver. 2. Small amount of ascites.  Bilateral pleural effusions. 3. Mild bilateral renal cortical thinning. Bilateral renal cysts. No hydronephrosis. 4. Status post cholecystectomy.  No bile duct dilation. Electronically Signed   By: Amie Portland M.D.   On: 07/27/2019 15:53   DG Chest Port 1 View  Result Date: 06/23/2019 CLINICAL DATA:  CHF. Additional history provided: Weakness, hypotension EXAM: PORTABLE CHEST 1 VIEW COMPARISON:  Chest radiograph 07-27-2019 FINDINGS: The cardiac silhouette is largely obscured. Aortic atherosclerosis. New from prior examination, there is opacity throughout the left hemithorax which may reflect a large pleural effusion and/or atelectasis. Redemonstrated pulmonary edema within the right lung. Persistent moderate right pleural effusion with right basilar atelectasis. No evidence of  pneumothorax. Overlying cardiac monitoring leads. These results were called by telephone at the time of interpretation on  at 7:55 am to provider Dr.Gherge, who verbally acknowledged these results. IMPRESSION: Opacity throughout the left hemithorax, new from prior examination. This may reflect a large pleural effusion and/or atelectasis. Consider ultrasound for further evaluation. Pulmonary edema within the right lung. Persistent moderate right pleural effusion with right basilar atelectasis. Electronically Signed   By: Jackey Loge DO   On: 06/26/2019 07:56   DG Chest Portable 1 View  Result Date: July 27, 2019 CLINICAL DATA:  Sepsis EXAM: PORTABLE CHEST 1 VIEW COMPARISON:  09/07/2018 FINDINGS: Cardiac enlargement. Congestive heart failure with pulmonary edema and moderate pleural effusions. Bibasilar atelectasis. Atherosclerotic aorta. IMPRESSION: Congestive heart failure with pulmonary edema and moderate pleural effusions. Bibasilar compressive atelectasis. Electronically Signed   By: Marlan Palau M.D.   On: 2019-07-27 11:55    Cardiac Studies   Echo pending  Patient Profile     85 y.o. male with a hx of paroxysmal atrial fibrillation s/p DCCV, depression,  history of bleeding on DOAC, failure to thrive, hypotension, non-ischemic cardiomyopathy, and chronic indwelling Foley catheter who is being seen today for the evaluation of afib and heart failure.   Assessment & Plan    Paroxysmal atrial fibrillation s/p DCCV -Long history of afib.  -In the past he has been on Eliquis. Previously discontinued due to hemoptysis. CHA2DS2/VAS Stroke Risk Score 3 (CHF, Age(2)). -Pt was on diltiazem in the past, decreased due to hypotension.  -Pt presented with afib with RVR. On tele, in and out of Afib.  -One dose of IV dig given yesterday. Now on diltiazem 5 mg/hr. Currently rates well controlled in the 80's.  Cardiomyopathy -Documented nonobstructive cardiomyopathy -EF 45-50% with mild LVH by  echo in 2016. -Echo done today, pending results.  - Initial CXR suggestive of CHF with pulmonary edema and moderate pleural effusions. This morning CXR shows possible worsened large pleural effusion.  - BNP 729 -Has been given a dose of lasix 40 mg IV this morning. Appears to have a good amount of urine in foley bag.  -He is requiring oxygen by non-rebreathier and has significant JVD. He will likely need more lasix dosing today. Dr. Anne Fu to see pt.   Large pleural effusion -Per critical care, likely transudative given rapid accumulation and likely related to rapid fluid administration, although left side is unusual location for heart failure related effusion. He is felt to be at increased risk for thoracentesis due to weakness, functional decline and risk of reexpansion pulmonary edema.  -Critical care recommends addressing goals of care, possible consideration of comfort care. He is currently DNR. -Pt appears to be gravely ill and likely has poor prognosis.   Failure to thrive -Chronic issue since last year.  -Albumin 2.6 -Further workup per IM -Consider palliative care  AKI - creatinine 1.45 on admission. Cr 1.21 today.  Acute Cystitis/AMS - Head CT negative for acute findings - Patient has chronic indwelling catheter and was being treated for UTI as outpatient - Lactic acid normal - Abx per IM  HTN - In the recent past this was not an issue due to hypotension - BP currently stable.    For questions or updates, please contact [facility] Please consult [email or web address] for contact info under        Signed, Berton Bon, NP  Aug 02, 2019, 10:08 AM

## 2019-07-24 NOTE — Progress Notes (Signed)
Progress Note  Patient Name: Gary Bean Date of Encounter: 08/06/19  Primary Cardiologist: Minus Breeding, MD   Subjective   In bed, facemask oxygen, slightly labored breathing, fairly unresponsive, sometimes opens eyes  Inpatient Medications    Scheduled Meds: . furosemide      . heparin  5,000 Units Subcutaneous Q8H  . lactobacillus acidophilus  1 tablet Oral Daily  . sertraline  75 mg Oral QPM  . sodium chloride flush  3 mL Intravenous Q12H  . vortioxetine HBr  20 mg Oral QHS   Continuous Infusions: . sodium chloride    . ceFEPime (MAXIPIME) IV    . dextrose 5 % and 0.9% NaCl Stopped (08-06-19 0840)  . diltiazem (CARDIZEM) infusion 5 mg/hr (08-06-2019 0403)   PRN Meds: sodium chloride, acetaminophen, ondansetron (ZOFRAN) IV, sodium chloride flush   Vital Signs    Vitals:   Aug 06, 2019 0415 Aug 06, 2019 0500 08-06-19 0513 August 06, 2019 0735  BP: (!) 115/59  113/71 116/66  Pulse: 89  73 68  Resp: 20  18 (!) 21  Temp:   97.6 F (36.4 C) 98.4 F (36.9 C)  TempSrc:   Oral Axillary  SpO2: 93%  92% 95%  Weight:  68.4 kg    Height:        Intake/Output Summary (Last 24 hours) at 06-Aug-2019 1012 Last data filed at 08/06/2019 0900 Gross per 24 hour  Intake 2824.72 ml  Output 250 ml  Net 2574.72 ml   Last 3 Weights 08/06/2019 07/18/2019 09/07/2018  Weight (lbs) 150 lb 12.7 oz 143 lb 143 lb  Weight (kg) 68.4 kg 64.864 kg 64.864 kg  Some encounter information is confidential and restricted. Go to Review Flowsheets activity to see all data.      Telemetry    Atrial fibrillation heart rate currently in the 80s, 10 beat run of VT nonsustained- Personally Reviewed  ECG    No new- Personally Reviewed  Physical Exam   GEN:  Gravely ill, elderly, disheveled appearing.   Neck: No JVD Cardiac: Irregularly irregular, no murmurs, rubs, or gallops.  Respiratory:  Coarse breath sounds bilaterally  GI: Soft, nontender, non-distended  MS: Trace edema; No deformity. Neuro:    Unable Psych: Unable  Labs    High Sensitivity Troponin:  No results for input(s): TROPONINIHS in the last 720 hours.    Chemistry Recent Labs  Lab 07/11/2019 1100 08/06/2019 0348  NA 144 147*  K 4.0 4.0  CL 109 108  CO2 25 27  GLUCOSE 66* 129*  BUN 44* 42*  CREATININE 1.45* 1.21  CALCIUM 8.4* 8.4*  PROT 5.3* 5.1*  ALBUMIN 2.6* 2.5*  AST 54* 45*  ALT 50* 52*  ALKPHOS 59 61  BILITOT 2.0* 1.9*  GFRNONAA 45* 55*  GFRAA 52* >60  ANIONGAP 10 12     Hematology Recent Labs  Lab 07/06/2019 1100 Aug 06, 2019 0348  WBC 9.8 10.7*  RBC 4.48 4.62  HGB 13.0 13.4  HCT 43.5 46.3  MCV 97.1 100.2*  MCH 29.0 29.0  MCHC 29.9* 28.9*  RDW 16.1* 16.1*  PLT 152 155    BNP Recent Labs  Lab 06/28/2019 1150  BNP 729.9*     DDimer No results for input(s): DDIMER in the last 168 hours.   Radiology    CT Head Wo Contrast  Result Date: 07/01/2019 CLINICAL DATA:  Weakness.  Refusing to eat. EXAM: CT HEAD WITHOUT CONTRAST TECHNIQUE: Contiguous axial images were obtained from the base of the skull through the vertex without intravenous  contrast. COMPARISON:  11/30/2018 FINDINGS: Brain: No evidence of acute infarction, hemorrhage, hydrocephalus, extra-axial collection or mass lesion/mass effect. Minor periventricular white matter hypoattenuation is noted consistent with chronic microvascular ischemic change. Vascular: No hyperdense vessel or unexpected calcification. Skull: Normal. Negative for fracture or focal lesion. Sinuses/Orbits: Globes and orbits are unremarkable. Sinuses are clear. Other: None. IMPRESSION: 1. No acute intracranial abnormalities. 2. Minor chronic microvascular ischemic change. Stable appearance from the prior head CT. Electronically Signed   By: Amie Portland M.D.   On: 07-30-2019 13:02   US Abdomen Complete  Result Date: 2019/07/30 CLINICAL DATA:  Elevated liver function test. EXAM: ABDOMEN ULTRASOUND COMPLETE COMPARISON:  CT, 09/07/2018 FINDINGS: Gallbladder: Status post  cholecystectomy Common bile duct: Diameter: 4 mm Liver: No focal lesion identified. Within normal limits in parenchymal echogenicity. Portal vein is patent on color Doppler imaging with normal direction of blood flow towards the liver. IVC: No abnormality visualized. Pancreas: Visualized portion unremarkable. Spleen: Size and appearance within normal limits. Right Kidney: Length: 10.9 cm. Mild renal cortical thinning. Parenchymal calcification in the midpole. Midpole cyst, 1.2 cm. Second mid upper pole exophytic cyst, 1.6 cm. No hydronephrosis. Left Kidney: Length: 11.0 cm. Mild renal cortical thinning. Prominent exophytic cyst, midpole, 4.8 x 3.9 x 4 cm. Smaller lower pole cyst measuring 2.2 x 2.0 x 2.2 cm. No stones. No hydronephrosis. Abdominal aorta: Diffuse atherosclerotic irregularity.  No aneurysm. Other findings: Small amount of ascites. Bilateral pleural effusions. IMPRESSION: 1. No acute findings.  Normal sonographic appearance of the liver. 2. Small amount of ascites.  Bilateral pleural effusions. 3. Mild bilateral renal cortical thinning. Bilateral renal cysts. No hydronephrosis. 4. Status post cholecystectomy.  No bile duct dilation. Electronically Signed   By: Amie Portland M.D.   On: 07/30/2019 15:53   DG Chest Port 1 View  Result Date: 06/25/2019 CLINICAL DATA:  CHF. Additional history provided: Weakness, hypotension EXAM: PORTABLE CHEST 1 VIEW COMPARISON:  Chest radiograph 07-30-19 FINDINGS: The cardiac silhouette is largely obscured. Aortic atherosclerosis. New from prior examination, there is opacity throughout the left hemithorax which may reflect a large pleural effusion and/or atelectasis. Redemonstrated pulmonary edema within the right lung. Persistent moderate right pleural effusion with right basilar atelectasis. No evidence of pneumothorax. Overlying cardiac monitoring leads. These results were called by telephone at the time of interpretation on 07/19/2019 at 7:55 am to provider  Dr.Gherge, who verbally acknowledged these results. IMPRESSION: Opacity throughout the left hemithorax, new from prior examination. This may reflect a large pleural effusion and/or atelectasis. Consider ultrasound for further evaluation. Pulmonary edema within the right lung. Persistent moderate right pleural effusion with right basilar atelectasis. Electronically Signed   By: Jackey Loge DO   On: 07/12/2019 07:56   DG Chest Portable 1 View  Result Date: 07/30/2019 CLINICAL DATA:  Sepsis EXAM: PORTABLE CHEST 1 VIEW COMPARISON:  09/07/2018 FINDINGS: Cardiac enlargement. Congestive heart failure with pulmonary edema and moderate pleural effusions. Bibasilar atelectasis. Atherosclerotic aorta. IMPRESSION: Congestive heart failure with pulmonary edema and moderate pleural effusions. Bibasilar compressive atelectasis. Electronically Signed   By: Marlan Palau M.D.   On: 07-30-19 11:55     Patient Profile     85 y.o. male with acute cystitis, failure to thrive, atrial fibrillation with rapid ventricular response currently improved rate control with large pleural effusion  Assessment & Plan    Paroxysmal atrial fibrillation -Long history of PAF.  Eliquis was previously discontinued because of hemoptysis.  Not felt to be a good candidate for anticoagulation.  CHA2DS2-VASc  3.  Diltiazem was decreased at home to 120 secondary to hypotension. -Yesterday in emergency room A. fib was RVR but telemetry showed brief periods of what appeared to be sinus rhythm with PACs.  Diltiazem drip initiated then.  Nonischemic cardiomyopathy -Prior echocardiogram showed EF of 45 to 50%.  Repeat echo has been performed awaiting read.  Chest x-ray demonstrated what looked like moderate pleural effusions and pulmonary edema with BNP of 729.  Mild edema on exam. -Large pleural effusion, critical care pulmonary note reviewed likely transudate of given accumulation rapidly, left-sided unusual location for heart failure  however.  He states clearly that thoracentesis would not necessarily provide a substantial and long-lasting improvement to his overall symptomatology.  Reluctant to perform.  Indicated that comfort care transition may be the most reasonable option.  Failure to thrive -Discussed with daughter precipitous decline.  Palliative care team consulted.  DNR.  Based upon his physical exam today, I also think that we should likely progress to comfort care measures.  Acute cystitis -Acute mental status changes likely result in part from acute cystitis.  Indwelling Foley.  Antibiotics.  Recently completed a course of antibiotics as well.  Grave overall prognosis.  Spouse, discussed with.  Understands situation.       For questions or updates, please contact [facility] Please consult [email or web address] for contact info under        Signed, Donato Schultz, MD  06/30/2019, 10:12 AM

## 2019-07-24 NOTE — Progress Notes (Addendum)
SLP Cancellation Note  Patient Details Name: Gary Bean MRN: [id number] DOB: 02-21-1935   Cancelled treatment:        Attempted to see pt for swallowing evaluation. Pt unavailable 2/2 echo at time of attempt.  Palliative meeting planned this morning to discuss goals of care.  SLP will reattempt as schedule permits.  Second attempt (1115): Pt not medically appropriate.  Requiring nonrebreather, agonal breathing noted.  SLP will hold evaluation this date and reattempt when medically appropriate.    Kerrie Pleasure, MA, CCC-SLP Acute Rehabilitation Services Office: [phone]  Aug 04, 2019, 10:13 AM

## 2019-07-24 NOTE — Progress Notes (Signed)
Visited with patient per unit nurse page.  Prayed with patient and wife at bedside.  Provided emotional and spiritual support.  Wife has familoy and friend coming to continue support.  Chaplain will follow as needed.  Venida Jarvis, [place], [facility], Pager [phone]

## 2019-07-24 NOTE — Consult Note (Addendum)
NAME:  Gary Bean, MRN:  [id number], DOB:  07-13-1934, LOS: 1 ADMISSION DATE:  07/28/2019, CONSULTATION DATE:  07/17/2019 REFERRING MD:  Cruzita Lederer, CHIEF COMPLAINT:  Left pleural effusion.   HPI/course in hospital  85 year old man with progressive functional decline. He has not been eating or drinking much for past 2 days.  In the ED he was hypotensive with rapid atrial fibrillation. He was thought to be dehydrated and was given 2L of fluids.  This morning, he CXR shows complete opacification the left lung field. The patient is unable to provide history himself, so we cannot ascertain how symptomatic he might be from a respiratory standpoint. In the ED he was initially on 2lpm but has been on NRB since.  Past Medical History   Past Medical History:  Diagnosis Date  . Anxiety   . Atrial fibrillation ([place])   . Depression   . Dizzy 07/27/2017  . Hypertension   . Nephrolithiasis      Past Surgical History:  Procedure Laterality Date  . CARDIOVERSION N/A 11/30/2014   Procedure: CARDIOVERSION;  Surgeon: Pixie Casino, MD;  Location: [facility] ENDOSCOPY;  Service: Cardiovascular;  Laterality: N/A;  09:19 elective cardioversion with propofol anesthesia,  @ 150 joules from Afib to SR with occassional PVC  . CHOLECYSTECTOMY    . KIDNEY STONE SURGERY       Review of Systems:   Review of Systems  Unable to perform ROS: Mental status change    Social History   reports that he has quit smoking. His smoking use included cigarettes. He has a 10.00 pack-year smoking history. He has never used smokeless tobacco. He reports current alcohol use. He reports that he does not use drugs.   Family History   His family history includes CAD (age of onset: 59) in his father; Depression in his sister; Goiter in his mother; Prostate cancer in his brother; Rectal cancer (age of onset: 58) in his sister.   Allergies Allergies  Allergen Reactions  . Eliquis [Apixaban] Other (See Comments)    Made the  patient's gums bleed profusely  . Xarelto [Rivaroxaban] Other (See Comments)    Made the patient's gums bleed profusely     Home Medications  Prior to Admission medications   Medication Sig Start Date End Date Taking? Authorizing Provider  acetaminophen (TYLENOL) 500 MG tablet Take 500 mg by mouth every 6 (six) hours as needed for mild pain or headache.    Yes [provider]  sertraline (ZOLOFT) 50 MG tablet Take 1.5 tablets (75 mg total) by mouth daily. Patient taking differently: Take 75 mg by mouth See admin instructions. Take 75 mg by mouth once a day at 3 PM 11/10/17  Yes Arfeen, Arlyce Harman, MD  TIADYLT ER 120 MG 24 hr capsule Take 120 mg by mouth daily. 04/25/19  Yes [provider]  TRINTELLIX 20 MG TABS tablet Take 20 mg by mouth at bedtime. 08/20/18  Yes [provider]     Interim history/subjective:  Dr Cruzita Lederer has confirmed with his wife that she didn't want extraordinary measures such as intubation and CPR.  Objective   Blood pressure 116/66, pulse 68, temperature 98.4 F (36.9 C), temperature source Axillary, resp. rate (!) 21, height 5\' 7"  (1.702 m), weight 68.4 kg, SpO2 95 %.    FiO2 (%):  [21 %] 21 %   Intake/Output Summary (Last 24 hours) at 07/05/2019 0850 Last data filed at 07/16/2019 0740 Gross per 24 hour  Intake 2100  ml  Output 250 ml  Net 1850 ml   Filed Weights   07/23/2019 1100 Aug 17, 2019 0500  Weight: 64.9 kg 68.4 kg    Examination: Physical Exam Constitutional:      General: He is in acute distress.     Appearance: He is ill-appearing.     Comments: Marked cachexia  HENT:     Mouth/Throat:     Mouth: Mucous membranes are dry.     Comments: Dry and cracked mucus membranes Cardiovascular:     Heart sounds: Normal heart sounds.  Pulmonary:     Comments: Mild increase in respiratory distress.  Rhonchi right side. Distant breath sounds on the left. Abdominal:     Palpations: Abdomen is soft.  Musculoskeletal:     Comments:  Marked muscle wasting.  Skin:    General: Skin is dry.  Neurological:     Comments: Lying immobile, no response to voice.    I performed a chest ultrasound which shows large left pleural effusion.  Ancillary tests (personally reviewed)  CBC: Recent Labs  Lab 07/18/2019 1100 08/17/2019 0348  WBC 9.8 10.7*  NEUTROABS 8.1* 9.0*  HGB 13.0 13.4  HCT 43.5 46.3  MCV 97.1 100.2*  PLT 152 155    Basic Metabolic Panel: Recent Labs  Lab 07/12/2019 1100 2019/08/17 0348  NA 144 147*  K 4.0 4.0  CL 109 108  CO2 25 27  GLUCOSE 66* 129*  BUN 44* 42*  CREATININE 1.45* 1.21  CALCIUM 8.4* 8.4*   GFR: Estimated Creatinine Clearance: 44 mL/min (by C-G formula based on SCr of 1.21 mg/dL). Recent Labs  Lab 07/02/2019 1100 07/05/2019 1103 08/17/19 0348  WBC 9.8  --  10.7*  LATICACIDVEN  --  1.5  --     Liver Function Tests: Recent Labs  Lab 07/06/2019 1100 17-Aug-2019 0348  AST 54* 45*  ALT 50* 52*  ALKPHOS 59 61  BILITOT 2.0* 1.9*  PROT 5.3* 5.1*  ALBUMIN 2.6* 2.5*   No results for input(s): LIPASE, AMYLASE in the last 168 hours. Recent Labs  Lab 07/08/2019 1807  AMMONIA 19    ABG No results found for: PHART, PCO2ART, PO2ART, HCO3, TCO2, ACIDBASEDEF, O2SAT   Coagulation Profile: Recent Labs  Lab 06/27/2019 1100  INR 1.4*    Cardiac Enzymes: No results for input(s): CKTOTAL, CKMB, CKMBINDEX, TROPONINI in the last 168 hours.  HbA1C: No results found for: HGBA1C  CBG: No results for input(s): GLUCAP in the last 168 hours.  CXR: background interstitial pattern of both lungs.  Interval development of complete opacification left lung field.  (Personal interpretation)  Assessment & Plan:   Large pleural effusion - type unknown but likely transudative given rapid accumulation and likely related to rapid fluid administration yesterday although the left side is an unusual location for a heart failure related effusion.  While it is certainly technically feasible to tap it even in  the supine position, I have significant concerns that we will not materially improve his outcome even in the short-term.  Indeed, he presented with weakness and functional decline rather than shortness of breath and I suspect that the effusion is simply an epiphenomenon in a larger functional decline from which there is unlikely to be a significant chance of recovery, particularly in light of his profound cachexia and decline in cognitive abilities.  The fact that he developed such a significant effusion following a reasonable fluid resuscitation for dehydration demonstrates just how frail he is and how it is nearly impossible to  provide active care for this patient without causing some degree of harm.  While a thoracentesis is usually a procedure with a minimal risk of complication, those were already frail or more likely to suffer an adverse event such as bleeding or significant worsening of breathing from reexpansion pulmonary edema.  Given that is not clear that the thoracentesis would necessarily provide a substantial and long-lasting improvement in his overall symptomatology, I am reluctant to perform it at this time.  Given his overall prognosis, I believe that a transition to comfort care would be the most reasonable option.  I have communicated this to Dr. Elvera Lennox, and have indicated to him that he should further establish goals of care with his wife.  If she is insistent that we should try a thoracentesis I am happy to perform it with the caveats indicated above.  55 minutes spent with greater than 50% of time in counseling and coordination of care.  Lynnell Catalan, MD [facility] ICU Physician [facility] [place] [facility]  Pager: [phone] Mobile: [phone] After hours: [phone].  08/07/2019, 8:50 AM

## 2019-07-24 DEATH — deceased

## 2019-07-25 ENCOUNTER — Other Ambulatory Visit: Payer: Self-pay | Admitting: Nephrology

## 2019-08-15 DIAGNOSIS — Z66 Do not resuscitate: Secondary | ICD-10-CM | POA: Diagnosis present

## 2019-08-15 DIAGNOSIS — Z515 Encounter for palliative care: Secondary | ICD-10-CM

## 2019-08-15 DIAGNOSIS — Z7189 Other specified counseling: Secondary | ICD-10-CM

## 2019-08-21 DEATH — deceased

## 2020-07-23 DEATH — deceased
